# Patient Record
Sex: Male | Born: 1950 | Race: White | Hispanic: No | Smoking: Former smoker
Health system: Southern US, Community
[De-identification: ages and names within clinical notes are randomized; demographics above are authoritative.]

## PROBLEM LIST (undated history)

## (undated) DIAGNOSIS — J45909 Unspecified asthma, uncomplicated: Secondary | ICD-10-CM

## (undated) DIAGNOSIS — I10 Essential (primary) hypertension: Secondary | ICD-10-CM

## (undated) DIAGNOSIS — B019 Varicella without complication: Secondary | ICD-10-CM

## (undated) DIAGNOSIS — K4090 Unilateral inguinal hernia, without obstruction or gangrene, not specified as recurrent: Secondary | ICD-10-CM

## (undated) DIAGNOSIS — J449 Chronic obstructive pulmonary disease, unspecified: Secondary | ICD-10-CM

## (undated) HISTORY — DX: Unilateral inguinal hernia, without obstruction or gangrene, not specified as recurrent: K40.90

## (undated) HISTORY — DX: Unspecified asthma, uncomplicated: J45.909

## (undated) HISTORY — DX: Essential (primary) hypertension: I10

## (undated) HISTORY — DX: Varicella without complication: B01.9

---

## 2013-05-22 DIAGNOSIS — K4090 Unilateral inguinal hernia, without obstruction or gangrene, not specified as recurrent: Secondary | ICD-10-CM

## 2013-05-22 HISTORY — DX: Unilateral inguinal hernia, without obstruction or gangrene, not specified as recurrent: K40.90

## 2013-05-22 HISTORY — PX: HERNIA REPAIR: SHX51

## 2013-06-18 ENCOUNTER — Encounter: Payer: Self-pay | Admitting: General Surgery

## 2013-06-18 ENCOUNTER — Ambulatory Visit (INDEPENDENT_AMBULATORY_CARE_PROVIDER_SITE_OTHER): Payer: BC Managed Care – PPO | Admitting: General Surgery

## 2013-06-18 VITALS — BP 180/90 | HR 98 | Resp 14 | Ht 72.0 in | Wt 200.0 lb

## 2013-06-18 DIAGNOSIS — K4090 Unilateral inguinal hernia, without obstruction or gangrene, not specified as recurrent: Secondary | ICD-10-CM

## 2013-06-18 DIAGNOSIS — J209 Acute bronchitis, unspecified: Secondary | ICD-10-CM

## 2013-06-18 MED ORDER — PREDNISONE (PAK) 10 MG PO TABS: ORAL_TABLET | Freq: Every day | ORAL | Status: DC

## 2013-06-18 NOTE — Progress Notes (Addendum)
Patient ID: Jordan [name], male   DOB: [birth year], 63 y.o.   MRN: [id number]  Chief Complaint  Patient presents with  . Abdominal Pain    New pt evaluation of right inguinal hernia    HPI Jordan [name] is a 62 y.o. male who presents for an evaluation of a right inguinal hernia. The patient complains of abdominal pain that started on Monday morning. The pain is constant that is described as a burning pain. He states he injured himself 10 years ago when moving into a new home but has not had any problems until this week. At the time of his injury 10 years ago, he became aware of a small grape-sized nodule in the right groin. This has persisted without variance.  His current episode developed after a significant upper respiratory infection associated with prominent coughing. He was treated with both Tessilon pearls as well as a ventolin  inhaler at a local urgent care center last week.  The pain is triggered by sitting. Laying down helps to alleviate the pain. He states on Monday he started coughing really bad and that's when he noticed the swelling in the area.   HPI  Past Medical History  Diagnosis Date  . Abdominal pain     History reviewed. No pertinent past surgical history.  History reviewed. No pertinent family history.  Social History History  Substance Use Topics  . Smoking status: Current Every Day Smoker  . Smokeless tobacco: Never Used  . Alcohol Use: Yes    No Known Allergies  Current Outpatient Prescriptions  Medication Sig Dispense Refill  . HYDROcodone-acetaminophen (NORCO/VICODIN) 5-325 MG per tablet Take 1 tablet by mouth every 6 (six) hours as needed for moderate pain.       No current facility-administered medications for this visit.    Review of Systems Review of Systems  Constitutional: Negative.   Respiratory: Negative.   Cardiovascular: Negative.   Gastrointestinal: Positive for abdominal pain.    Blood pressure 180/90, pulse 98, resp.  rate 14, height 6' (1.829 m), weight 200 lb (90.719 kg).  Physical Exam Physical Exam  Constitutional: He is oriented to person, place, and time. He appears well-developed and well-nourished.  Neck: Neck supple. No thyromegaly present.  Cardiovascular: Normal rate, regular rhythm and normal heart sounds.   No murmur heard. Pulmonary/Chest: Effort normal. He has wheezes.  Abdominal: Soft. Normal appearance and bowel sounds are normal. A hernia is present. Hernia confirmed positive in the right inguinal area (reducible ).  Lymphadenopathy:    He has no cervical adenopathy.  Neurological: He is alert and oriented to person, place, and time.  Skin: Skin is warm and dry.    Data Reviewed Chest x-ray reportedly completed outside facility it to be reviewed.  Assessment    Bronchitis, still with wheezing above noted medications.  Reducible right inguinal hernia.     Plan    With the severity of his cough and ongoing bronchospasm, he'll be placed on a steroid taper. Smoking cessation has been encouraged. Once his upper respiratory infection resolved we can look to early scheduling for a right open inguinal hernia were appear with the use of prosthetic mesh. The pros and cons of prosthetic mesh usage including the small risk of infection but improved long-term result was reviewed.  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  An off work note was provided.       Patient instructed to call the office once he is feeling better and we will arrange for right inguinal hernia repair at [facility]. This patient will require a brief pre-op visit. His surgery will need to be arranged within one week of his phone call.   Earline MayotteByrnett, Jeffrey W 06/18/2013, 9:37 PM

## 2013-06-18 NOTE — Patient Instructions (Addendum)
Patient to be scheduled for hernia repair. The patient is advised to call with any new questions or concerns.   Hernia Repair with Laparoscope A hernia occurs when an internal organ pushes out through a weak spot in the belly (abdominal) wall muscles. Hernias most commonly occur in the groin and around the navel. Hernias can also occur through a cut by the surgeon (incision) after an abdominal operation. A hernia may be caused by:  Lifting heavy objects.  Prolonged coughing.  Straining to move your bowels. Hernias can often be pushed back into place (reduced). Most hernias tend to get worse over time. Problems occur when abdominal contents get stuck in the opening and the blood supply is blocked or impaired (incarcerated hernia). Because of these risks, you require surgery to repair the hernia. Your hernia will be repaired using a laparoscope. Laparoscopic surgery is a type of minimally invasive surgery. It does not involve making a typical surgical cut (incision) in the skin. A laparoscope is a telescope-like rod and lens system. It is usually connected to a video camera and a light source so your caregiver can clearly see the operative area. The instruments are inserted through  to  inch (5 mm or 10 mm) openings in the skin at specific locations. A working and viewing space is created by blowing a small amount of carbon dioxide gas into the abdominal cavity. The abdomen is essentially blown up like a balloon (insufflated). This elevates the abdominal wall above the internal organs like a dome. The carbon dioxide gas is common to the human body and can be absorbed by tissue and removed by the respiratory system. Once the repair is completed, the small incisions will be closed with either stitches (sutures) or staples (just like a paper stapler only this staple holds the skin together). LET YOUR CAREGIVERS KNOW ABOUT:  Allergies.  Medications taken including herbs, eye drops, over the counter  medications, and creams.  Use of steroids (by mouth or creams).  Previous problems with anesthetics or Novocaine.  Possibility of pregnancy, if this applies.  History of blood clots (thrombophlebitis).  History of bleeding or blood problems.  Previous surgery.  Other health problems. BEFORE THE PROCEDURE  Laparoscopy can be done either in a hospital or out-patient clinic. You may be given a mild sedative to help you relax before the procedure. Once in the operating room, you will be given a general anesthesia to make you sleep (unless you and your caregiver choose a different anesthetic).  AFTER THE PROCEDURE  After the procedure you will be watched in a recovery area. Depending on what type of hernia was repaired, you might be admitted to the hospital or you might go home the same day. With this procedure you may have less pain and scarring. This usually results in a quicker recovery and less risk of infection. HOME CARE INSTRUCTIONS   Bed rest is not required. You may continue your normal activities but avoid heavy lifting (more than 10 pounds) or straining.  Cough gently. If you are a smoker it is best to stop, as even the best hernia repair can break down with the continual strain of coughing.  Avoid driving until given the OK by your surgeon.  There are no dietary restrictions unless given otherwise.  TAKE ALL MEDICATIONS AS DIRECTED.  Only take over-the-counter or prescription medicines for pain, discomfort, or fever as directed by your caregiver. SEEK MEDICAL CARE IF:   There is increasing abdominal pain or pain in your  incisions.  There is more bleeding from incisions, other than minimal spotting.  You feel light headed or faint.  You develop an unexplained fever, chills, and/or an oral temperature above 102 F (38.9 C).  You have redness, swelling, or increasing pain in the wound.  Pus coming from wound.  A foul smell coming from the wound or dressings. SEEK  IMMEDIATE MEDICAL CARE IF:   You develop a rash.  You have difficulty breathing.  You have any allergic problems. MAKE SURE YOU:   Understand these instructions.  Will watch your condition.  Will get help right away if you are not doing well or get worse. Document Released: 05/08/2005 Document Revised: 07/31/2011 Document Reviewed: 04/07/2009 [facility] Patient Information 2014 [employer] [place].

## 2013-06-18 NOTE — Addendum Note (Signed)
Addended by: Earline MayotteBYRNETT, Kaleo Condrey W on: 06/18/2013 09:46 PM   Modules accepted: Orders

## 2013-06-19 ENCOUNTER — Encounter: Payer: Self-pay | Admitting: *Deleted

## 2013-06-19 ENCOUNTER — Telehealth: Payer: Self-pay | Admitting: *Deleted

## 2013-06-19 NOTE — Telephone Encounter (Signed)
Patient has been notified as instructed. Patient instructed to call our office with any questions or concerns.

## 2013-06-19 NOTE — Telephone Encounter (Signed)
Message copied by Darnelle CatalanRAWLEY, Rasul Decola F on Thu Jun 19, 2013  2:25 PM ------      Message from: [place] River CityBYRNETT, [place]JEFFREY W      Created: Wed Jun 18, 2013  9:46 PM       Please notify the patient I sent a prescription for prednisone to his pharmacy. He should start with 6 tablets, decreasing by one tablet daily until resolved. He should also make use of his Ventolin inhaler every 4 hours while awake until his respiratory symptoms have improved. ------

## 2013-07-02 ENCOUNTER — Encounter: Payer: Self-pay | Admitting: *Deleted

## 2013-07-07 ENCOUNTER — Encounter: Payer: Self-pay | Admitting: General Surgery

## 2013-07-23 ENCOUNTER — Telehealth: Payer: Self-pay | Admitting: *Deleted

## 2013-07-23 NOTE — Telephone Encounter (Signed)
Patient's surgery has been scheduled for 07-31-13 at [facility]. This patient will come in for a pre-op visit on Monday, 07-28-13 at 10 am.

## 2013-07-28 ENCOUNTER — Other Ambulatory Visit: Payer: Self-pay | Admitting: General Surgery

## 2013-07-28 ENCOUNTER — Encounter: Payer: Self-pay | Admitting: General Surgery

## 2013-07-28 ENCOUNTER — Ambulatory Visit (INDEPENDENT_AMBULATORY_CARE_PROVIDER_SITE_OTHER): Payer: BC Managed Care – PPO | Admitting: General Surgery

## 2013-07-28 VITALS — BP 130/70 | HR 78 | Resp 14 | Ht 72.0 in | Wt 189.0 lb

## 2013-07-28 DIAGNOSIS — K4090 Unilateral inguinal hernia, without obstruction or gangrene, not specified as recurrent: Secondary | ICD-10-CM

## 2013-07-28 NOTE — Progress Notes (Signed)
Patient ID: Jordan [name], male   DOB: 06-18-1950, 63 y.o.   MRN: [id number]  Chief Complaint  Patient presents with  . Pre-op Exam    right inguinal hernia    HPI Jordan MissDavid Alton Woldt is a 63 y.o. male  Here for a pre op exam for repair of right inguinal hernia, this has been scheduled for 07/31/13. When the patient was seen at the end of January 2015 significant wheezing was noted in both lung fields. He was placed on a short course of steroids.He reported slow resolution of his symptoms. At this time he feels his breathing is better.  The patient is still experiencing significant pain in the right groin, especially when he is active. Occasionally he'll feel pain moving up to the periumbilical area. He reports no difficulty with bowel function there is no change in his urinary pattern. No blood per rectum.  The patient's weight is down 11 pounds from his January exam.    HPI  Past Medical History  Diagnosis Date  . Abdominal pain     No past surgical history on file.  No family history on file.  Social History History  Substance Use Topics  . Smoking status: Current Every Day Smoker  . Smokeless tobacco: Never Used  . Alcohol Use: Yes    No Known Allergies  Current Outpatient Prescriptions  Medication Sig Dispense Refill  . albuterol (PROVENTIL HFA;VENTOLIN HFA) 108 (90 BASE) MCG/ACT inhaler Inhale 2 puffs into the lungs every 6 (six) hours as needed for wheezing or shortness of breath.      . benzonatate (TESSALON) 100 MG capsule Take 200 mg by mouth 3 (three) times daily as needed for cough.      Marland Kitchen. HYDROcodone-acetaminophen (NORCO/VICODIN) 5-325 MG per tablet Take 1 tablet by mouth every 6 (six) hours as needed for moderate pain.       No current facility-administered medications for this visit.    Review of Systems Review of Systems  Constitutional: Negative.   Respiratory: Negative.   Cardiovascular: Negative.     Blood pressure 130/70, pulse 78, resp. rate  14, height 6' (1.829 m), weight 189 lb (85.73 kg).  Physical Exam Physical Exam  Constitutional: He is oriented to person, place, and time. He appears well-developed and well-nourished.  Eyes: Conjunctivae are normal.  Neck: Neck supple.  Cardiovascular: Normal rate, regular rhythm and normal heart sounds.   Pulmonary/Chest: Breath sounds normal.  Abdominal: Soft. Normal appearance and bowel sounds are normal. There is no hepatosplenomegaly. A hernia is present. Hernia confirmed positive in the right inguinal area.    Lymphadenopathy:       Right: No inguinal adenopathy present.       Left: No inguinal adenopathy present.  Neurological: He is alert and oriented to person, place, and time.  Skin: Skin is warm and dry.     Assessment    Symptomatic right inguinal hernia. Reducible.     Plan    The patient is scheduled for elective hernia repair on July 31, 2013. I advised him that because he has been so symptomatic in the weeks leading up to this he may experience significant postoperative pain that many people did not experience. Will make use of anti-inflammatories to help minimize his pain.        Earline MayotteByrnett, Jordan W 07/28/2013, 9:29 PM

## 2013-07-28 NOTE — Patient Instructions (Signed)

## 2013-07-31 ENCOUNTER — Ambulatory Visit: Payer: Self-pay | Admitting: General Surgery

## 2013-07-31 DIAGNOSIS — K4090 Unilateral inguinal hernia, without obstruction or gangrene, not specified as recurrent: Secondary | ICD-10-CM

## 2013-08-01 ENCOUNTER — Encounter: Payer: Self-pay | Admitting: General Surgery

## 2013-08-13 ENCOUNTER — Ambulatory Visit (INDEPENDENT_AMBULATORY_CARE_PROVIDER_SITE_OTHER): Payer: BC Managed Care – PPO | Admitting: General Surgery

## 2013-08-13 ENCOUNTER — Encounter: Payer: Self-pay | Admitting: General Surgery

## 2013-08-13 VITALS — BP 160/90 | HR 80 | Temp 96.8°F | Resp 14 | Ht 72.0 in | Wt 203.0 lb

## 2013-08-13 DIAGNOSIS — K4090 Unilateral inguinal hernia, without obstruction or gangrene, not specified as recurrent: Secondary | ICD-10-CM

## 2013-08-13 NOTE — Progress Notes (Signed)
Patient ID: Jordan MissDavid Alton Mazariego, male   DOB: Mar 01, 1951, 63 y.o.   MRN: [id number]  Chief Complaint  Patient presents with  . Routine Post Op    2 week post op right inguinal hernia repair.     HPI Jordan [name] is a 63 y.o. male who presents for a 2 week post op right inguinal hernia repair. The repair was performed on 07/31/13. The patient complains of chills, soreness, and swelling in the area of the repair. The patient has no fever at this time. He is still using the pain medication prescribed at this time. The patient continues to make use of Aleve, 2 tablets b.i.d. As requested. He is applying local heat frequently. The patient reports no difficulty with bowel or bladder function.  HPI  Past Medical History  Diagnosis Date  . Abdominal pain   . Inguinal hernia 2015    Past Surgical History  Procedure Laterality Date  . Hernia repair Right 2015    inguinal hernia    History reviewed. No pertinent family history.  Social History History  Substance Use Topics  . Smoking status: Current Every Day Smoker  . Smokeless tobacco: Never Used  . Alcohol Use: Yes    No Known Allergies  Current Outpatient Prescriptions  Medication Sig Dispense Refill  . albuterol (PROVENTIL HFA;VENTOLIN HFA) 108 (90 BASE) MCG/ACT inhaler Inhale 2 puffs into the lungs every 6 (six) hours as needed for wheezing or shortness of breath.      . benzonatate (TESSALON) 100 MG capsule Take 200 mg by mouth 3 (three) times daily as needed for cough.      Marland Kitchen. HYDROcodone-acetaminophen (NORCO/VICODIN) 5-325 MG per tablet Take 1 tablet by mouth every 6 (six) hours as needed for moderate pain.      . naproxen sodium (ANAPROX) 220 MG tablet Take 440 mg by mouth 2 (two) times daily.       No current facility-administered medications for this visit.    Review of Systems Review of Systems  Constitutional: Positive for chills.  Respiratory: Negative.   Cardiovascular: Negative.   Gastrointestinal: Negative.      Blood pressure 160/90, pulse 80, temperature 96.8 F (36 C), temperature source Oral, resp. rate 14, height 6' (1.829 m), weight 203 lb (92.08 kg).  Physical Exam Physical Exam  Constitutional: He is oriented to person, place, and time. He appears well-developed and well-nourished.  Pulmonary/Chest: Effort normal and breath sounds normal.  Abdominal: Soft. Normal appearance and bowel sounds are normal. There is no hepatosplenomegaly. There is no tenderness. No hernia.  Hernia repair intact. Normal healing ridge. Minimal cord swelling, No testicular edema  Neurological: He is alert and oriented to person, place, and time.  Skin: Skin is warm and dry.       Assessment    Doing well status post right aorta repair.     Plan    The patient was reassured that the healing ridge below the incision is a normal finding and should resolve with time. Continued use of local heat to help resolve the residual swelling was encouraged. He should continue use of Aleve b.i.d. Until the soreness is resolved.  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 We'll plan for a followup exam in one month.     PCP: No PCP per patient Ref. Physician: [facility] Dr. Gaynelle ArabianBrady Blumling    Earline MayotteByrnett, Mikeila Burgen W 08/13/2013, 7:58 PM

## 2013-08-13 NOTE — Patient Instructions (Signed)
Patient to return in 1 month. Patient to continue using heat and aleve for pain. The patient is aware to call back for any questions or concerns.

## 2013-09-15 ENCOUNTER — Ambulatory Visit (INDEPENDENT_AMBULATORY_CARE_PROVIDER_SITE_OTHER): Payer: BC Managed Care – PPO | Admitting: General Surgery

## 2013-09-15 ENCOUNTER — Encounter: Payer: Self-pay | Admitting: General Surgery

## 2013-09-15 VITALS — BP 134/76 | HR 76 | Resp 12 | Ht 72.0 in | Wt 203.0 lb

## 2013-09-15 DIAGNOSIS — K4090 Unilateral inguinal hernia, without obstruction or gangrene, not specified as recurrent: Secondary | ICD-10-CM

## 2013-09-15 NOTE — Progress Notes (Signed)
Patient ID: Jordan [name], male   DOB: [birth year], 63 y.o.   MRN: [id number]  Chief Complaint  Patient presents with  . Routine Post Op    one month right inguinal hernia    HPI Jordan [name] is a 63 y.o. male who presents for a 6 week post op evaluation status post right inguinal hernia repair. The repair was performed on 07/31/13. Patient states he is doing well. The mild residual pain reported at his last visit has resolved.   HPI  Past Medical History  Diagnosis Date  . Abdominal pain   . Inguinal hernia 2015    Past Surgical History  Procedure Laterality Date  . Hernia repair Right 2015    inguinal hernia    No family history on file.  Social History History  Substance Use Topics  . Smoking status: Current Every Day Smoker  . Smokeless tobacco: Never Used  . Alcohol Use: Yes    No Known Allergies  Current Outpatient Prescriptions  Medication Sig Dispense Refill  . albuterol (PROVENTIL HFA;VENTOLIN HFA) 108 (90 BASE) MCG/ACT inhaler Inhale 2 puffs into the lungs every 6 (six) hours as needed for wheezing or shortness of breath.      . benzonatate (TESSALON) 100 MG capsule Take 200 mg by mouth 3 (three) times daily as needed for cough.      . CHERATUSSIN AC 100-10 MG/5ML syrup       . clarithromycin (BIAXIN XL) 500 MG 24 hr tablet Take 1 tablet by mouth daily.      . naproxen sodium (ANAPROX) 220 MG tablet Take 440 mg by mouth 2 (two) times daily.      . predniSONE (DELTASONE) 10 MG tablet Take 1 tablet by mouth daily.       No current facility-administered medications for this visit.    Review of Systems Review of Systems  Constitutional: Negative.   Respiratory: Negative.   Cardiovascular: Negative.     Blood pressure 134/76, pulse 76, resp. rate 12, height 6' (1.829 m), weight 203 lb (92.08 kg).  Physical Exam Physical Exam  Constitutional: He is oriented to person, place, and time. He appears well-developed and well-nourished.  Abdominal:   Right inguinal repair looks clean and well healed. .  Neurological: He is alert and oriented to person, place, and time.  Skin: Skin is warm and dry.       Assessment    Doing well status post inguinal hernia repair.     Plan    The patient is released to full activities. Good judgment was strenuous activity has been discussed. The paperwork provided by the patient for his disability will be completed by the nurse next week. Follow up care will be on an as needed basis.          Merrily PewJeffrey W Lue Sykora 09/15/2013, 9:32 PM

## 2013-09-15 NOTE — Patient Instructions (Signed)
Patient to return as needed. Proper lifting techniques reviewed. 

## 2014-01-28 ENCOUNTER — Ambulatory Visit: Payer: Self-pay

## 2014-09-12 NOTE — Op Note (Signed)
PATIENT NAME:  Jordan [name], Jordan [name] MR#:  [id number] DATE OF BIRTH:  1951-03-08  DATE OF PROCEDURE:  07/31/2013  PREOPERATIVE DIAGNOSIS:  Right inguinal hernia.   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   OPERATIVE PROCEDURE:  Excision of lipoma of the cord and repair of direct inguinal hernia with Ultrapro mesh.   SURGEON:  Donnalee CurryJeffrey Samit Sylve, M.D.   ANESTHESIA:  General by LMA under Dr. Ronalee BeltsBhandari, Marcaine 0.5% with 1:200,000 units epinephrine, 30 mL local infiltration, Toradol 30 mg.   ESTIMATED BLOOD LOSS:  Minimal.   CLINICAL NOTE:  This 64 year old male has developed [name] symptomatic right inguinal hernia.  He was admitted for elective repair.  He received Kefzol prior to the procedure.   OPERATIVE NOTE:  With the patient under adequate general anesthesia and hair previously removed with clippers, the area was prepped with ChloraPrep and draped.  [name] over the anticipated course of the inguinal canal was carried down through the skin and subcutaneous tissue with hemostasis achieved by electrocautery.  3-0 Vicryl ties were used as well.  The external oblique was opened in the direction of its fibers.  The ilioinguinal, iliohypogastric and genitofemoral nerve were all identified and protected.  The thin cremasteric fibers were split and the cord was mobilized.  There was found to be [name] which was dissected back to the level of the internal ring, ligated with 3-0 ties of Vicryl and then discarded.  The direct hernia sac was excised and discarded.  The preperitoneal space was cleared.  [name] was smoothed into position in the preperitoneal position and then the external component laid along the floor of the inguinal canal.  [name] was made for cord passage.  The mesh was anchored to the pubic tubercle with 0 Surgilon and then along the inguinal ligament and the transverse abdominis aponeurosis or inferior  as well as medial and superior coverage, respectively.  Toradol was placed into the wound and the external oblique closed with [name].  The Scarpa's fascia was closed with 3-0 Vicryl, and the skin approximated with [name].  Benzoin, Steri-Strips, Telfa and Tegaderm dressings were then applied.    The patient tolerated the procedure well and was taken recovery in stable condition.    ____________________________ Earline MayotteJeffrey W. Iram Astorino, MD jwb:ea D: 07/31/2013 16:29:28 ET T: 08/01/2013 05:18:12 ET JOB#: [id number]  cc: Earline MayotteJeffrey W. Jamoni Broadfoot, MD, <Dictator> Alphonzo Severanceeborah D. Brady, NP Earline MayotteJeffrey W. Sheralyn Pinegar, MD  Reaghan Kawa Brion AlimentW Antonius Hartlage MD ELECTRONICALLY SIGNED 08/01/2013 11:17

## 2014-10-10 ENCOUNTER — Other Ambulatory Visit: Payer: Self-pay

## 2014-10-10 ENCOUNTER — Emergency Department
Admission: EM | Admit: 2014-10-10 | Discharge: 2014-10-10 | Disposition: A | Discharge status: Home / Self Care | Payer: BLUE CROSS/BLUE SHIELD | Attending: Emergency Medicine | Admitting: Emergency Medicine

## 2014-10-10 ENCOUNTER — Encounter: Payer: Self-pay | Admitting: Emergency Medicine

## 2014-10-10 ENCOUNTER — Emergency Department: Payer: BLUE CROSS/BLUE SHIELD

## 2014-10-10 DIAGNOSIS — J441 Chronic obstructive pulmonary disease with (acute) exacerbation: Secondary | ICD-10-CM | POA: Insufficient documentation

## 2014-10-10 DIAGNOSIS — Z79899 Other long term (current) drug therapy: Secondary | ICD-10-CM | POA: Insufficient documentation

## 2014-10-10 DIAGNOSIS — Z720 Tobacco use: Secondary | ICD-10-CM | POA: Insufficient documentation

## 2014-10-10 DIAGNOSIS — Z791 Long term (current) use of non-steroidal anti-inflammatories (NSAID): Secondary | ICD-10-CM | POA: Insufficient documentation

## 2014-10-10 DIAGNOSIS — Z792 Long term (current) use of antibiotics: Secondary | ICD-10-CM | POA: Insufficient documentation

## 2014-10-10 LAB — BASIC METABOLIC PANEL
Anion gap: 9 (ref 5–15)
BUN: 13 mg/dL (ref 6–20)
CO2: 25 mmol/L (ref 22–32)
Calcium: 9.7 mg/dL (ref 8.9–10.3)
Chloride: 104 mmol/L (ref 101–111)
Creatinine, Ser: 1.06 mg/dL (ref 0.61–1.24)
GFR calc Af Amer: >60 mL/min (ref >60)
GFR calc non Af Amer: >60 mL/min (ref >60)
Glucose, Bld: 112 mg/dL — ABNORMAL HIGH (ref 65–99)
Potassium: 4.2 mmol/L (ref 3.5–5.1)
Sodium: 138 mmol/L (ref 135–145)

## 2014-10-10 LAB — CBC
HCT: 46.2 % (ref 40.0–52.0)
Hemoglobin: 15.4 g/dL (ref 13.0–18.0)
MCH: 30.2 pg (ref 26.0–34.0)
MCHC: 33.3 g/dL (ref 32.0–36.0)
MCV: 90.7 fL (ref 80.0–100.0)
Platelets: 266 10*3/uL (ref 150–440)
RBC: 5.1 MIL/uL (ref 4.40–5.90)
RDW: 13.7 % (ref 11.5–14.5)
WBC: 12.4 10*3/uL — AB (ref 3.8–10.6)

## 2014-10-10 LAB — TROPONIN I: Troponin I: <0.03 ng/mL (ref <0.031)

## 2014-10-10 MED ORDER — PREDNISONE 20 MG PO TABS
ORAL_TABLET | ORAL | Status: AC
  Administered 2014-10-10: 60 mg via ORAL
  Filled 2014-10-10: qty 3

## 2014-10-10 MED ORDER — IPRATROPIUM-ALBUTEROL 0.5-2.5 (3) MG/3ML IN SOLN
RESPIRATORY_TRACT | Status: AC
  Administered 2014-10-10: 3 mL via RESPIRATORY_TRACT
  Filled 2014-10-10: qty 3

## 2014-10-10 MED ORDER — PREDNISONE 20 MG PO TABS
60.0000 mg | ORAL_TABLET | Freq: Once | ORAL | Status: AC
  Administered 2014-10-10: 60 mg via ORAL

## 2014-10-10 MED ORDER — IPRATROPIUM-ALBUTEROL 0.5-2.5 (3) MG/3ML IN SOLN
3.0000 mL | Freq: Once | RESPIRATORY_TRACT | Status: AC
  Administered 2014-10-10: 3 mL via RESPIRATORY_TRACT

## 2014-10-10 MED ORDER — PREDNISONE 10 MG (21) PO TBPK: ORAL_TABLET | ORAL | Status: DC

## 2014-10-10 MED ORDER — ALBUTEROL SULFATE HFA 108 (90 BASE) MCG/ACT IN AERS: 2.0000 | INHALATION_SPRAY | Freq: Four times a day (QID) | RESPIRATORY_TRACT | Status: DC | PRN

## 2014-10-10 MED ORDER — ALBUTEROL SULFATE (2.5 MG/3ML) 0.083% IN NEBU
INHALATION_SOLUTION | RESPIRATORY_TRACT | Status: AC
  Administered 2014-10-10: 2.5 mg via RESPIRATORY_TRACT
  Filled 2014-10-10: qty 3

## 2014-10-10 MED ORDER — ALBUTEROL SULFATE (2.5 MG/3ML) 0.083% IN NEBU
2.5000 mg | INHALATION_SOLUTION | Freq: Once | RESPIRATORY_TRACT | Status: AC
  Administered 2014-10-10: 2.5 mg via RESPIRATORY_TRACT

## 2014-10-10 MED ORDER — AZITHROMYCIN 250 MG PO TABS: ORAL_TABLET | ORAL | Status: DC

## 2014-10-10 NOTE — ED Provider Notes (Signed)
[facility] Emergency Department Provider Note  ____________________________________________  Time seen: Approximately 1:10 PM  I have reviewed the triage vital signs and the nursing notes.   HISTORY  Chief Complaint Shortness of Breath    HPI Jordan [name] is a 64 y.o. male who presents to the emergency department for shortness of breath that started at approximately 4 this am. He reports that he has had a "cold" for a week or so. Cough was productive, but over the past couple of days he has not been able to cough anything up. He reports chills and subjective fever 2 nights ago.   Past Medical History  Diagnosis Date  . Abdominal pain   . Inguinal hernia 2015    Patient Active Problem List   Diagnosis Date Noted  . Inguinal hernia 06/18/2013    Past Surgical History  Procedure Laterality Date  . Hernia repair Right 2015    inguinal hernia    Current Outpatient Rx  Name  Route  Sig  Dispense  Refill  . albuterol (PROVENTIL HFA;VENTOLIN HFA) 108 (90 BASE) MCG/ACT inhaler   Inhalation   Inhale 2 puffs into the lungs every 6 (six) hours as needed for wheezing or shortness of breath.   1 Inhaler   0   . azithromycin (ZITHROMAX Z-PAK) 250 MG tablet      Take 2 tablets (500 mg) on  Day 1,  followed by 1 tablet (250 mg) once daily on Days 2 through 5.   6 each   0   . benzonatate (TESSALON) 100 MG capsule   Oral   Take 200 mg by mouth 3 (three) times daily as needed for cough.         . CHERATUSSIN AC 100-10 MG/5ML syrup               . clarithromycin (BIAXIN XL) 500 MG 24 hr tablet   Oral   Take 1 tablet by mouth daily.         . naproxen sodium (ANAPROX) 220 MG tablet   Oral   Take 440 mg by mouth 2 (two) times daily.         . predniSONE (STERAPRED UNI-PAK 21 TAB) 10 MG (21) TBPK tablet      Take 6 tablets on day 1 Take 5 tablets on day 2 Take 4 tablets on day 3 Take 3 tablets on day 4 Take 2 tablets on day  5 Take 1 tablet on day 6   21 tablet   0     Allergies Review of patient's allergies indicates no known allergies.  No family history on file.  Social History History  Substance Use Topics  . Smoking status: Current Every Day Smoker -- 0.50 packs/day  . Smokeless tobacco: Never Used  . Alcohol Use: No    Review of Systems Constitutional: No fever/chills today Eyes: No visual changes. ENT: No sore throat. Cardiovascular: Denies chest pain. Respiratory: Positive for shortness of breath. Gastrointestinal: No abdominal pain.  No nausea, no vomiting.  No diarrhea.  No constipation. Genitourinary: Negative for dysuria. Musculoskeletal: Negative for back pain. Skin: Negative for rash. Neurological: Negative for headaches, focal weakness or numbness.  10-point ROS otherwise negative.  ____________________________________________   PHYSICAL EXAM:  VITAL SIGNS: ED Triage Vitals  Enc Vitals Group     BP 10/10/14 1240 130/99 mmHg     Pulse Rate 10/10/14 1240 99     Resp 10/10/14 1240 22     Temp  10/10/14 1240 97.8 F (36.6 C)     Temp Source 10/10/14 1240 Oral     SpO2 10/10/14 1240 100 %     Weight 10/10/14 1240 200 lb (90.719 kg)     Height 10/10/14 1240 6' (1.829 m)     Head Cir --      Peak Flow --      Pain Score 10/10/14 1241 0     Pain Loc --      Pain Edu? --      Excl. in GC? --     Constitutional: Alert and oriented. Well appearing and in no acute distress. Eyes: Conjunctivae are normal. PERRL. EOMI. Head: Atraumatic. Nose: No congestion/rhinnorhea. Mouth/Throat: Mucous membranes are moist.  Oropharynx non-erythematous. Neck: No stridor.   Hematological/Lymphatic/Immunilogical: No cervical lymphadenopathy. Cardiovascular: Normal rate, regular rhythm. Grossly normal heart sounds.  Good peripheral circulation. Respiratory: Normal respiratory effort.  No retractions. Wheezing present throughout. Gastrointestinal: Soft and nontender. No distention. No  abdominal bruits. No CVA tenderness. Musculoskeletal: No lower extremity tenderness nor edema.  No joint effusions. Neurologic:  Normal speech and language. No gross focal neurologic deficits are appreciated. Speech is normal. No gait instability. Skin:  Skin is warm, dry and intact. No rash noted. Psychiatric: Mood and affect are normal. Speech and behavior are normal.  ____________________________________________   LABS (all labs ordered are listed, but only abnormal results are displayed)  Labs Reviewed  CBC - Abnormal; Notable for the following:    WBC 12.4 (*)    All other components within normal limits  BASIC METABOLIC PANEL - Abnormal; Notable for the following:    Glucose, Bld 112 (*)    All other components within normal limits  TROPONIN I   ____________________________________________  EKG  Normal sinus rhythm with a rate of 94 ____________________________________________  RADIOLOGY  Chest x-ray negative for infiltrate or opacity. Consistent with COPD changes. ____________________________________________   PROCEDURES  Procedure(s) performed: None  Critical Care performed: No  ____________________________________________   INITIAL IMPRESSION / ASSESSMENT AND PLAN / ED COURSE  Pertinent labs & imaging results that were available during my care of the patient were reviewed by me and considered in my medical decision making (see chart for details).  Initial impression bronchitis versus pneumonia. Albuterol SVN ordered as well as labs and chest x-ray.  Presumptive improved after DuoNeb treatment. Patient was strongly advised to stop smoking. He is reportedly unaware of COPD diagnosis. We discussed this in length. He was encouraged to establish primary care provider for long-term management. He'll be discharged home with prednisone and albuterol. Return precautions discussed. ____________________________________________   FINAL CLINICAL IMPRESSION(S) / ED  DIAGNOSES  Final diagnoses:  COPD with acute exacerbation     Chinita Pester, FNP 10/10/14 1454  Jene Every, MD 10/10/14 1459

## 2014-10-10 NOTE — ED Notes (Signed)
Appears uncomfortable, speaking 4 word sentence

## 2014-10-10 NOTE — ED Notes (Signed)
NAD noted at time of D/C. Pt denies questions or concerns. Pt ambulatory to the lobby at this time.  

## 2014-10-10 NOTE — ED Provider Notes (Signed)
EKG interpreted by me  Date: 10/10/2014  Rate: 94  Rhythm: normal sinus rhythm  QRS Axis: normal  Intervals: normal  ST/T Wave abnormalities: normal  Conduction Disutrbances: none  Narrative Interpretation: unremarkable      Jordan Everyobert Adren Dollins, MD 10/10/14 1318

## 2014-10-21 ENCOUNTER — Other Ambulatory Visit: Payer: Self-pay

## 2014-10-21 ENCOUNTER — Encounter: Payer: Self-pay | Admitting: Emergency Medicine

## 2014-10-21 ENCOUNTER — Emergency Department: Payer: BLUE CROSS/BLUE SHIELD

## 2014-10-21 ENCOUNTER — Emergency Department
Admission: EM | Admit: 2014-10-21 | Discharge: 2014-10-21 | Disposition: A | Discharge status: Home / Self Care | Payer: BLUE CROSS/BLUE SHIELD | Attending: Emergency Medicine | Admitting: Emergency Medicine

## 2014-10-21 DIAGNOSIS — J441 Chronic obstructive pulmonary disease with (acute) exacerbation: Secondary | ICD-10-CM

## 2014-10-21 DIAGNOSIS — Z720 Tobacco use: Secondary | ICD-10-CM | POA: Insufficient documentation

## 2014-10-21 HISTORY — DX: Chronic obstructive pulmonary disease, unspecified: J44.9

## 2014-10-21 LAB — CBC
HEMATOCRIT: 44.3 % (ref 40.0–52.0)
Hemoglobin: 15.1 g/dL (ref 13.0–18.0)
MCH: 31.1 pg (ref 26.0–34.0)
MCHC: 34 g/dL (ref 32.0–36.0)
MCV: 91.5 fL (ref 80.0–100.0)
PLATELETS: 288 10*3/uL (ref 150–440)
RBC: 4.84 MIL/uL (ref 4.40–5.90)
RDW: 13.8 % (ref 11.5–14.5)
WBC: 11.2 10*3/uL — ABNORMAL HIGH (ref 3.8–10.6)

## 2014-10-21 LAB — BASIC METABOLIC PANEL
ANION GAP: 9 (ref 5–15)
BUN: 14 mg/dL (ref 6–20)
CALCIUM: 9.3 mg/dL (ref 8.9–10.3)
CO2: 24 mmol/L (ref 22–32)
Chloride: 103 mmol/L (ref 101–111)
Creatinine, Ser: 1.09 mg/dL (ref 0.61–1.24)
GFR calc Af Amer: >60 mL/min (ref >60)
GFR calc non Af Amer: >60 mL/min (ref >60)
Glucose, Bld: 116 mg/dL — ABNORMAL HIGH (ref 65–99)
Potassium: 4.1 mmol/L (ref 3.5–5.1)
Sodium: 136 mmol/L (ref 135–145)

## 2014-10-21 LAB — BRAIN NATRIURETIC PEPTIDE: B NATRIURETIC PEPTIDE 5: 12 pg/mL (ref 0.0–100.0)

## 2014-10-21 LAB — TROPONIN I: Troponin I: <0.03 ng/mL (ref <0.031)

## 2014-10-21 MED ORDER — ALBUTEROL SULFATE HFA 108 (90 BASE) MCG/ACT IN AERS: 2.0000 | INHALATION_SPRAY | RESPIRATORY_TRACT | Status: DC | PRN

## 2014-10-21 MED ORDER — IPRATROPIUM-ALBUTEROL 0.5-2.5 (3) MG/3ML IN SOLN
RESPIRATORY_TRACT | Status: AC
  Administered 2014-10-21: 3 mL via RESPIRATORY_TRACT
  Filled 2014-10-21: qty 3

## 2014-10-21 MED ORDER — IPRATROPIUM-ALBUTEROL 0.5-2.5 (3) MG/3ML IN SOLN
3.0000 mL | RESPIRATORY_TRACT | Status: AC
  Administered 2014-10-21: 3 mL via RESPIRATORY_TRACT

## 2014-10-21 MED ORDER — PREDNISONE 20 MG PO TABS
60.0000 mg | ORAL_TABLET | Freq: Once | ORAL | Status: AC
  Administered 2014-10-21: 60 mg via ORAL

## 2014-10-21 MED ORDER — IPRATROPIUM-ALBUTEROL 0.5-2.5 (3) MG/3ML IN SOLN
RESPIRATORY_TRACT | Status: AC
  Filled 2014-10-21: qty 3

## 2014-10-21 MED ORDER — PREDNISONE 20 MG PO TABS
ORAL_TABLET | ORAL | Status: AC
  Administered 2014-10-21: 60 mg via ORAL
  Filled 2014-10-21: qty 3

## 2014-10-21 MED ORDER — PREDNISONE 20 MG PO TABS: 40.0000 mg | ORAL_TABLET | Freq: Every day | ORAL | Status: DC

## 2014-10-21 MED ORDER — DOXYCYCLINE HYCLATE 50 MG PO CAPS: 100.0000 mg | ORAL_CAPSULE | Freq: Two times a day (BID) | ORAL | Status: DC

## 2014-10-21 MED ORDER — IPRATROPIUM-ALBUTEROL 0.5-2.5 (3) MG/3ML IN SOLN
3.0000 mL | Freq: Once | RESPIRATORY_TRACT | Status: AC
  Administered 2014-10-21: 3 mL via RESPIRATORY_TRACT

## 2014-10-21 NOTE — ED Notes (Signed)
Pt presents with shortness of breath today. Was seen here last week and dx with bronchitis. Audible wheezing throughout.

## 2014-10-21 NOTE — Discharge Instructions (Signed)

## 2014-10-21 NOTE — ED Notes (Signed)
Pt reports he was diagnosed with Bronchitis about 2 weeks ago, pt reports he finished his antibiotics. Pt reports he felt good for about a day and started to feel tight in his lungs, pt states he quit smoking about 5 days ago, pt has a non-productive cough. Pt denies any fever at home. Pt denies any pain. Pt talks in complete sentences no respiratory distress noted.

## 2014-10-21 NOTE — ED Notes (Signed)
Pt verbalizes the importance to follow pt with PCP

## 2014-10-21 NOTE — ED Provider Notes (Signed)
[facility] Emergency Department Provider Note  ____________________________________________  Time seen: 7:30 PM  I have reviewed the triage vital signs and the nursing notes.   HISTORY  Chief Complaint Shortness of Breath    HPI Jordan [name] is a 64 y.o. male who complains of persistent shortness of breath and nonproductive cough for the past 5 days. He was treated with azithromycin 2 weeks ago for bronchitis, but felt that after he finished the course his symptoms started getting worse again. The shortness of breath is worse with walking but not associated with any chest pain headache lightheadedness syncope abdominal pain and back pain. He has no PND or orthopnea. He does not have a primary care doctor at this time     Past Medical History  Diagnosis Date  . Abdominal pain   . Inguinal hernia 2015  . COPD (chronic obstructive pulmonary disease)     Patient Active Problem List   Diagnosis Date Noted  . Inguinal hernia 06/18/2013    Past Surgical History  Procedure Laterality Date  . Hernia repair Right 2015    inguinal hernia    Current Outpatient Rx  Name  Route  Sig  Dispense  Refill  . albuterol (PROVENTIL HFA) 108 (90 BASE) MCG/ACT inhaler   Inhalation   Inhale 2 puffs into the lungs every 4 (four) hours as needed for wheezing or shortness of breath.   1 Inhaler   0   . doxycycline (VIBRAMYCIN) 50 MG capsule   Oral   Take 2 capsules (100 mg total) by mouth 2 (two) times daily.   56 capsule   0   . predniSONE (DELTASONE) 20 MG tablet   Oral   Take 2 tablets (40 mg total) by mouth daily.   10 tablet   0     Allergies Review of patient's allergies indicates no known allergies.  History reviewed. No pertinent family history.  Social History History  Substance Use Topics  . Smoking status: Current Every Day Smoker -- 0.50 packs/day  . Smokeless tobacco: Never Used  . Alcohol Use: No    Review of  Systems  Constitutional: No fever or chills. No weight changes Eyes:No blurry vision or double vision.  ENT: No sore throat. Cardiovascular: No chest pain. Respiratory: Dyspnea and nonproductive cough Gastrointestinal: Negative for abdominal pain, vomiting and diarrhea.  No BRBPR or melena. Genitourinary: Negative for dysuria, urinary retention, bloody urine, or difficulty urinating. Musculoskeletal: Negative for back pain. No joint swelling or pain. Skin: Negative for rash. Neurological: Negative for headaches, focal weakness or numbness. Psychiatric:No anxiety or depression.   Endocrine:No hot/cold intolerance, changes in energy, or sleep difficulty.  10-point ROS otherwise negative.  ____________________________________________   PHYSICAL EXAM:  VITAL SIGNS: ED Triage Vitals  Enc Vitals Group     BP 10/21/14 1806 148/96 mmHg     Pulse Rate 10/21/14 1806 93     Resp 10/21/14 1806 20     Temp 10/21/14 1806 98 F (36.7 C)     Temp Source 10/21/14 1806 Oral     SpO2 10/21/14 1806 94 %     Weight 10/21/14 1806 200 lb (90.719 kg)     Height 10/21/14 1806 6' (1.829 m)     Head Cir --      Peak Flow --      Pain Score --      Pain Loc --      Pain Edu? --      Excl. in  GC? --      Constitutional: Alert and oriented. Well appearing and in no distress. Eyes: No scleral icterus. No conjunctival pallor. PERRL. EOMI ENT   Head: Normocephalic and atraumatic.   Nose: No congestion/rhinnorhea. No septal hematoma   Mouth/Throat: MMM, no pharyngeal erythema. No peritonsillar mass. No uvula shift.   Neck: No stridor. No SubQ emphysema. No meningismus. Hematological/Lymphatic/Immunilogical: No cervical lymphadenopathy. Cardiovascular: RRR. Normal and symmetric distal pulses are present in all extremities. No murmurs, rubs, or gallops. Respiratory: Expiratory wheezing diffusely. Symmetric breath sounds throughout without focal consolidation.  Gastrointestinal: Soft  and nontender. No distention. There is no CVA tenderness.  No rebound, rigidity, or guarding. Genitourinary: deferred Musculoskeletal: Nontender with normal range of motion in all extremities. No joint effusions.  No lower extremity tenderness.  No edema. Neurologic:   Normal speech and language.  CN 2-10 normal. Motor grossly intact. No pronator drift.  Normal gait. No gross focal neurologic deficits are appreciated.  Skin:  Skin is warm, dry and intact. No rash noted.  No petechiae, purpura, or bullae. Psychiatric: Mood and affect are normal. Speech and behavior are normal. Patient exhibits appropriate insight and judgment.  ____________________________________________    LABS (pertinent positives/negatives) (all labs ordered are listed, but only abnormal results are displayed) Labs Reviewed  CBC - Abnormal; Notable for the following:    WBC 11.2 (*)    All other components within normal limits  BASIC METABOLIC PANEL - Abnormal; Notable for the following:    Glucose, Bld 116 (*)    All other components within normal limits  BRAIN NATRIURETIC PEPTIDE  TROPONIN I   ____________________________________________   EKG  Interpreted by me  Date: 10/21/2014  Rate: 90  Rhythm: normal sinus rhythm  QRS Axis: normal  Intervals: normal  ST/T Wave abnormalities: normal  Conduction Disutrbances: none  Narrative Interpretation: unremarkable      ____________________________________________    RADIOLOGY  Chest x-ray consistent with emphysema, no acute pathology  ____________________________________________   PROCEDURES  ____________________________________________   INITIAL IMPRESSION / ASSESSMENT AND PLAN / ED COURSE  Pertinent labs & imaging results that were available during my care of the patient were reviewed by me and considered in my medical decision making (see chart for details).  Patient presents with acute COPD exacerbation related to chronic emphysema.  I encouraged the patient again primary care doctor due to his chronic medical problems. We'll give him steroids and DuoNeb here in the ED for symptomatic relief. His vital signs are unremarkable and there is no evidence of pneumonia. I have low suspicion for ACS TAD carditis mediastinitis or PE. We'll then discharge him with a course of prednisone, doxycycline, and an albuterol inhaler prescription.  ____________________________________________   FINAL CLINICAL IMPRESSION(S) / ED DIAGNOSES  Final diagnoses:  COPD with acute exacerbation      Sharman Cheek, MD 10/21/14 [phone]

## 2014-10-30 ENCOUNTER — Emergency Department: Payer: BLUE CROSS/BLUE SHIELD

## 2014-10-30 ENCOUNTER — Encounter: Payer: Self-pay | Admitting: Emergency Medicine

## 2014-10-30 ENCOUNTER — Inpatient Hospital Stay
Admission: EM | Admit: 2014-10-30 | Discharge: 2014-11-02 | DRG: 189 | Disposition: A | Discharge status: Home / Self Care | Payer: BLUE CROSS/BLUE SHIELD | Attending: Internal Medicine | Admitting: Internal Medicine

## 2014-10-30 DIAGNOSIS — J441 Chronic obstructive pulmonary disease with (acute) exacerbation: Secondary | ICD-10-CM | POA: Diagnosis present

## 2014-10-30 DIAGNOSIS — Z720 Tobacco use: Secondary | ICD-10-CM | POA: Diagnosis present

## 2014-10-30 DIAGNOSIS — Z716 Tobacco abuse counseling: Secondary | ICD-10-CM | POA: Diagnosis present

## 2014-10-30 DIAGNOSIS — Z833 Family history of diabetes mellitus: Secondary | ICD-10-CM

## 2014-10-30 DIAGNOSIS — I10 Essential (primary) hypertension: Secondary | ICD-10-CM | POA: Diagnosis present

## 2014-10-30 DIAGNOSIS — J209 Acute bronchitis, unspecified: Secondary | ICD-10-CM | POA: Diagnosis present

## 2014-10-30 DIAGNOSIS — J9601 Acute respiratory failure with hypoxia: Secondary | ICD-10-CM | POA: Diagnosis present

## 2014-10-30 DIAGNOSIS — T380X5A Adverse effect of glucocorticoids and synthetic analogues, initial encounter: Secondary | ICD-10-CM | POA: Diagnosis present

## 2014-10-30 DIAGNOSIS — Z9889 Other specified postprocedural states: Secondary | ICD-10-CM

## 2014-10-30 DIAGNOSIS — J440 Chronic obstructive pulmonary disease with acute lower respiratory infection: Secondary | ICD-10-CM | POA: Diagnosis present

## 2014-10-30 DIAGNOSIS — D72829 Elevated white blood cell count, unspecified: Secondary | ICD-10-CM | POA: Diagnosis present

## 2014-10-30 DIAGNOSIS — Z825 Family history of asthma and other chronic lower respiratory diseases: Secondary | ICD-10-CM

## 2014-10-30 LAB — BASIC METABOLIC PANEL
ANION GAP: 9 (ref 5–15)
BUN: 13 mg/dL (ref 6–20)
CO2: 23 mmol/L (ref 22–32)
Calcium: 9.1 mg/dL (ref 8.9–10.3)
Chloride: 104 mmol/L (ref 101–111)
Creatinine, Ser: 1.13 mg/dL (ref 0.61–1.24)
GFR calc non Af Amer: >60 mL/min (ref >60)
Glucose, Bld: 151 mg/dL — ABNORMAL HIGH (ref 65–99)
POTASSIUM: 3.9 mmol/L (ref 3.5–5.1)
SODIUM: 136 mmol/L (ref 135–145)

## 2014-10-30 LAB — CBC
HCT: 42.7 % (ref 40.0–52.0)
Hemoglobin: 14.4 g/dL (ref 13.0–18.0)
MCH: 31.1 pg (ref 26.0–34.0)
MCHC: 33.8 g/dL (ref 32.0–36.0)
MCV: 92.1 fL (ref 80.0–100.0)
PLATELETS: 274 10*3/uL (ref 150–440)
RBC: 4.64 MIL/uL (ref 4.40–5.90)
RDW: 14 % (ref 11.5–14.5)
WBC: 15.3 10*3/uL — ABNORMAL HIGH (ref 3.8–10.6)

## 2014-10-30 LAB — TROPONIN I

## 2014-10-30 MED ORDER — IPRATROPIUM-ALBUTEROL 0.5-2.5 (3) MG/3ML IN SOLN
3.0000 mL | Freq: Once | RESPIRATORY_TRACT | Status: AC
  Administered 2014-10-30: 3 mL via RESPIRATORY_TRACT

## 2014-10-30 MED ORDER — LEVOFLOXACIN IN D5W 750 MG/150ML IV SOLN
INTRAVENOUS | Status: AC
  Administered 2014-10-30: 750 mg via INTRAVENOUS
  Filled 2014-10-30: qty 150

## 2014-10-30 MED ORDER — IPRATROPIUM-ALBUTEROL 0.5-2.5 (3) MG/3ML IN SOLN
RESPIRATORY_TRACT | Status: AC
  Administered 2014-10-30: 3 mL via RESPIRATORY_TRACT
  Filled 2014-10-30: qty 3

## 2014-10-30 MED ORDER — METHYLPREDNISOLONE SODIUM SUCC 125 MG IJ SOLR
INTRAMUSCULAR | Status: AC
  Administered 2014-10-30: 125 mg via INTRAVENOUS
  Filled 2014-10-30: qty 2

## 2014-10-30 MED ORDER — MAGNESIUM SULFATE 2 GM/50ML IV SOLN
2.0000 g | Freq: Once | INTRAVENOUS | Status: DC
  Administered 2014-10-31: 2 g via INTRAVENOUS

## 2014-10-30 MED ORDER — METHYLPREDNISOLONE SODIUM SUCC 125 MG IJ SOLR
125.0000 mg | Freq: Once | INTRAMUSCULAR | Status: AC
  Administered 2014-10-30: 125 mg via INTRAVENOUS

## 2014-10-30 MED ORDER — IPRATROPIUM-ALBUTEROL 0.5-2.5 (3) MG/3ML IN SOLN
3.0000 mL | Freq: Once | RESPIRATORY_TRACT | Status: AC
  Administered 2014-10-31: 3 mL via RESPIRATORY_TRACT

## 2014-10-30 MED ORDER — LEVOFLOXACIN IN D5W 750 MG/150ML IV SOLN
750.0000 mg | Freq: Once | INTRAVENOUS | Status: AC
  Administered 2014-10-30: 750 mg via INTRAVENOUS

## 2014-10-30 MED ORDER — SODIUM CHLORIDE 0.9 % IV BOLUS (SEPSIS)
1000.0000 mL | Freq: Once | INTRAVENOUS | Status: AC
  Administered 2014-10-30: 1000 mL via INTRAVENOUS

## 2014-10-30 MED ORDER — MAGNESIUM SULFATE 2 GM/50ML IV SOLN
INTRAVENOUS | Status: AC
  Administered 2014-10-31: 2 g via INTRAVENOUS
  Filled 2014-10-30: qty 50

## 2014-10-30 MED ORDER — IPRATROPIUM-ALBUTEROL 0.5-2.5 (3) MG/3ML IN SOLN
RESPIRATORY_TRACT | Status: AC
  Filled 2014-10-30: qty 3

## 2014-10-30 NOTE — ED Notes (Signed)
Patient c/o cough and shortness of breath along with fever.  Had a fever of 101.6, took 3 tylenol pills at 1600.  Temp now 98.7.  C/o air hunger, was seen 10/21/14 and was dx with bronchitis and acute copd exacerbation.  States the prednisone helps at first but then symptoms return in a few days.

## 2014-10-30 NOTE — ED Notes (Signed)
Patient reports having been seen 2 times here in the last couple of weeks, reports being diagnosed with bronchitis and acute exacerbation of COPD. Patient reports completing Prednisone on Monday and since that time has continually felt worse.

## 2014-10-30 NOTE — ED Notes (Signed)
Pulse ox noted to range 89-90% RA, placed on oxygen via [place] at 2 liters.

## 2014-10-30 NOTE — ED Notes (Signed)
Patient speaking in complete sentences.

## 2014-10-30 NOTE — ED Provider Notes (Signed)
[facility] Emergency Department Provider Note  ____________________________________________  Time seen: Approximately 11:09 PM  I have reviewed the triage vital signs and the nursing notes.   HISTORY  Chief Complaint Cough and Shortness of Breath    HPI Jordan [name] is a 64 y.o. male with history of COPD, former smoker who presents with 3 weeks gradual onset constant SOB and nonproductive cough. Was treated with z-pack, prednisone, albuterol without improvement. Today with worsening SOB and fever to 100.6. Currently symptoms are severe. Denies any chest pain. Breathing treatments and to make his symptoms better. No nausea, vomiting, diarrhea.   Past Medical History  Diagnosis Date  . Abdominal pain   . Inguinal hernia 2015  . COPD (chronic obstructive pulmonary disease)     Patient Active Problem List   Diagnosis Date Noted  . Inguinal hernia 06/18/2013    Past Surgical History  Procedure Laterality Date  . Hernia repair Right 2015    inguinal hernia    Current Outpatient Rx  Name  Route  Sig  Dispense  Refill  . albuterol (PROVENTIL HFA) 108 (90 BASE) MCG/ACT inhaler   Inhalation   Inhale 2 puffs into the lungs every 4 (four) hours as needed for wheezing or shortness of breath.   1 Inhaler   0   . doxycycline (VIBRAMYCIN) 50 MG capsule   Oral   Take 2 capsules (100 mg total) by mouth 2 (two) times daily.   56 capsule   0   . predniSONE (DELTASONE) 20 MG tablet   Oral   Take 2 tablets (40 mg total) by mouth daily.   10 tablet   0     Allergies Review of patient's allergies indicates no known allergies.  No family history on file.  Social History History  Substance Use Topics  . Smoking status: Current Every Day Smoker -- 0.50 packs/day  . Smokeless tobacco: Never Used  . Alcohol Use: No    Review of Systems Constitutional: + fever, no chills Eyes: No visual changes. ENT: No sore throat. Cardiovascular: Denies  chest pain. Respiratory: + shortness of breath. Gastrointestinal: No abdominal pain.  No nausea, no vomiting.  No diarrhea.  No constipation. Genitourinary: Negative for dysuria. Musculoskeletal: Negative for back pain. Skin: Negative for rash. Neurological: Negative for headaches, focal weakness or numbness.  10-point ROS otherwise negative.  ____________________________________________   PHYSICAL EXAM:  VITAL SIGNS: ED Triage Vitals  Enc Vitals Group     BP 10/30/14 1854 131/68 mmHg     Pulse Rate 10/30/14 1854 92     Resp 10/30/14 1854 18     Temp 10/30/14 1854 98.7 F (37.1 C)     Temp Source 10/30/14 1854 Oral     SpO2 10/30/14 1854 93 %     Weight --      Height --      Head Cir --      Peak Flow --      Pain Score 10/30/14 1856 0     Pain Loc --      Pain Edu? --      Excl. in GC? --     Constitutional: Alert and oriented. Sitting in tripod position, in moderate respiratory distress. Eyes: Conjunctivae are normal. PERRL. EOMI. Head: Atraumatic. Nose: No congestion/rhinnorhea. Mouth/Throat: Mucous membranes are moist.  Oropharynx non-erythematous. Neck: No stridor.  Cardiovascular: tachycardic rate, regular rhythm. Grossly normal heart sounds.  Good peripheral circulation. Respiratory: increased WOB, tachypnea, prolonged expiratory phase, diffuse expiratory  wheeze with poor air movement Gastrointestinal: Soft and nontender. No distention. No abdominal bruits. No CVA tenderness. Genitourinary: deferred Musculoskeletal: No lower extremity tenderness nor edema.  No joint effusions. Neurologic:  Normal speech and language. No gross focal neurologic deficits are appreciated. Speech is normal. No gait instability. Skin:  Skin is warm, dry and intact. No rash noted. Psychiatric: Mood and affect are normal. Speech and behavior are normal.  ____________________________________________   LABS (all labs ordered are listed, but only abnormal results are  displayed)  Labs Reviewed  BASIC METABOLIC PANEL - Abnormal; Notable for the following:    Glucose, Bld 151 (*)    All other components within normal limits  CBC - Abnormal; Notable for the following:    WBC 15.3 (*)    All other components within normal limits  CULTURE, BLOOD (ROUTINE X 2)  CULTURE, BLOOD (ROUTINE X 2)  TROPONIN I  LACTIC ACID, PLASMA  LACTIC ACID, PLASMA   ____________________________________________  EKG  ED ECG REPORT I, Gayla Doss, the attending physician, personally viewed and interpreted this ECG.   Date: 10/30/2014  EKG Time: 19:07  Rate: 97  Rhythm: normal EKG, normal sinus rhythm  Axis: normal  Intervals:none  ST&T Change: No acute ST segment elevation, Q waves in leads 3, lead 2, aVF  ____________________________________________  RADIOLOGY  CXR  IMPRESSION: Peribronchial thickening noted; lungs otherwise clear. ____________________________________________   PROCEDURES  Procedure(s) performed: None  Critical Care performed: Yes, see critical care note(s). Total critical care time spent 40 minutes.  ____________________________________________   INITIAL IMPRESSION / ASSESSMENT AND PLAN / ED COURSE  Pertinent labs & imaging results that were available during my care of the patient were reviewed by me and considered in my medical decision making (see chart for details).   ----------------------------------------- 12:13 AM on 10/31/2014 -----------------------------------------   [name] is a 64 y.o. male with history of COPD, former smoker who presents with 3 weeks gradual onset constant SOB and nonproductive cough. On exam, he has moderate respiratory distress, increased work of breathing, wheeze with poor air movement consistent with persistent COPD exacerbation. O2 sat 89-90% on room air. He has received 3 DuoNeb treatments, soluMedrol, magnesium, Levaquin and IV fluids. Because he has had minimal improvement in  symptoms after several neb treatments, discussed with hospitalist for admission at this time. ____________________________________________   FINAL CLINICAL IMPRESSION(S) / ED DIAGNOSES  Final diagnoses:  COPD with acute exacerbation      Gayla Doss, MD 10/31/14 0013

## 2014-10-31 ENCOUNTER — Encounter: Payer: Self-pay | Admitting: Internal Medicine

## 2014-10-31 DIAGNOSIS — J441 Chronic obstructive pulmonary disease with (acute) exacerbation: Secondary | ICD-10-CM | POA: Diagnosis present

## 2014-10-31 DIAGNOSIS — J209 Acute bronchitis, unspecified: Secondary | ICD-10-CM | POA: Diagnosis present

## 2014-10-31 DIAGNOSIS — Z720 Tobacco use: Secondary | ICD-10-CM | POA: Diagnosis present

## 2014-10-31 DIAGNOSIS — J9601 Acute respiratory failure with hypoxia: Secondary | ICD-10-CM | POA: Diagnosis present

## 2014-10-31 LAB — CBC
HEMATOCRIT: 42.5 % (ref 40.0–52.0)
Hemoglobin: 14.6 g/dL (ref 13.0–18.0)
MCH: 31.9 pg (ref 26.0–34.0)
MCHC: 34.3 g/dL (ref 32.0–36.0)
MCV: 92.9 fL (ref 80.0–100.0)
Platelets: 261 10*3/uL (ref 150–440)
RBC: 4.58 MIL/uL (ref 4.40–5.90)
RDW: 14.1 % (ref 11.5–14.5)
WBC: 17.3 10*3/uL — AB (ref 3.8–10.6)

## 2014-10-31 LAB — HEMOGLOBIN A1C: Hgb A1c MFr Bld: 5.9 % (ref 4.0–6.0)

## 2014-10-31 LAB — LACTIC ACID, PLASMA: Lactic Acid, Venous: 2 mmol/L (ref 0.5–2.0)

## 2014-10-31 MED ORDER — ALBUTEROL SULFATE (2.5 MG/3ML) 0.083% IN NEBU
2.5000 mg | INHALATION_SOLUTION | RESPIRATORY_TRACT | Status: DC | PRN
  Administered 2014-10-31 – 2014-11-02 (×9): 2.5 mg via RESPIRATORY_TRACT
  Filled 2014-10-31 (×10): qty 3

## 2014-10-31 MED ORDER — MENTHOL 3 MG MT LOZG
1.0000 | LOZENGE | OROMUCOSAL | Status: DC | PRN
  Filled 2014-10-31: qty 9

## 2014-10-31 MED ORDER — ONDANSETRON HCL 4 MG PO TABS: 4.0000 mg | ORAL_TABLET | Freq: Four times a day (QID) | ORAL | Status: DC | PRN

## 2014-10-31 MED ORDER — CEPASTAT 14.5 MG MT LOZG: 1.0000 | LOZENGE | OROMUCOSAL | Status: DC | PRN

## 2014-10-31 MED ORDER — GUAIFENESIN 100 MG/5ML PO SYRP
200.0000 mg | ORAL_SOLUTION | Freq: Four times a day (QID) | ORAL | Status: DC | PRN
  Administered 2014-10-31 – 2014-11-02 (×6): 200 mg via ORAL
  Filled 2014-10-31 (×8): qty 10

## 2014-10-31 MED ORDER — MOMETASONE FURO-FORMOTEROL FUM 200-5 MCG/ACT IN AERO
2.0000 | INHALATION_SPRAY | Freq: Two times a day (BID) | RESPIRATORY_TRACT | Status: DC
  Administered 2014-10-31 – 2014-11-02 (×6): 2 via RESPIRATORY_TRACT
  Filled 2014-10-31: qty 8.8

## 2014-10-31 MED ORDER — LEVOFLOXACIN IN D5W 500 MG/100ML IV SOLN
500.0000 mg | INTRAVENOUS | Status: DC
  Administered 2014-10-31: 500 mg via INTRAVENOUS
  Filled 2014-10-31 (×2): qty 100

## 2014-10-31 MED ORDER — ENOXAPARIN SODIUM 40 MG/0.4ML ~~LOC~~ SOLN
40.0000 mg | SUBCUTANEOUS | Status: DC
  Administered 2014-10-31 – 2014-11-02 (×3): 40 mg via SUBCUTANEOUS
  Filled 2014-10-31 (×3): qty 0.4

## 2014-10-31 MED ORDER — IPRATROPIUM-ALBUTEROL 0.5-2.5 (3) MG/3ML IN SOLN
RESPIRATORY_TRACT | Status: AC
  Administered 2014-10-31: 3 mL via RESPIRATORY_TRACT
  Filled 2014-10-31: qty 3

## 2014-10-31 MED ORDER — ACETAMINOPHEN 325 MG PO TABS: 650.0000 mg | ORAL_TABLET | Freq: Four times a day (QID) | ORAL | Status: DC | PRN

## 2014-10-31 MED ORDER — IPRATROPIUM BROMIDE 0.02 % IN SOLN
0.5000 mg | Freq: Four times a day (QID) | RESPIRATORY_TRACT | Status: DC
  Administered 2014-10-31 – 2014-11-02 (×10): 0.5 mg via RESPIRATORY_TRACT
  Filled 2014-10-31 (×11): qty 2.5

## 2014-10-31 MED ORDER — ACETAMINOPHEN 650 MG RE SUPP: 650.0000 mg | Freq: Four times a day (QID) | RECTAL | Status: DC | PRN

## 2014-10-31 MED ORDER — PNEUMOCOCCAL VAC POLYVALENT 25 MCG/0.5ML IJ INJ
0.5000 mL | INJECTION | INTRAMUSCULAR | Status: AC
  Administered 2014-11-02: 0.5 mL via INTRAMUSCULAR
  Filled 2014-10-31: qty 0.5

## 2014-10-31 MED ORDER — METHYLPREDNISOLONE SODIUM SUCC 40 MG IJ SOLR
40.0000 mg | Freq: Four times a day (QID) | INTRAMUSCULAR | Status: DC
  Administered 2014-10-31 – 2014-11-02 (×11): 40 mg via INTRAVENOUS
  Filled 2014-10-31 (×12): qty 1

## 2014-10-31 MED ORDER — SODIUM CHLORIDE 0.9 % IJ SOLN
3.0000 mL | Freq: Two times a day (BID) | INTRAMUSCULAR | Status: DC
  Administered 2014-10-31 – 2014-11-01 (×4): 3 mL via INTRAVENOUS

## 2014-10-31 MED ORDER — ONDANSETRON HCL 4 MG/2ML IJ SOLN: 4.0000 mg | Freq: Four times a day (QID) | INTRAMUSCULAR | Status: DC | PRN

## 2014-10-31 NOTE — Progress Notes (Signed)
ANTIBIOTIC CONSULT NOTE - INITIAL  Pharmacy Consult for Levaquin Indication: COPD exacerbation  No Known Allergies  Patient Measurements: Weight: 206 lb 3.2 oz (93.532 kg)   Vital Signs: Temp: 97.5 F (36.4 C) (06/11 0139) Temp Source: Oral (06/11 0139) BP: 136/70 mmHg (06/11 0139) Pulse Rate: 88 (06/11 0139) Intake/Output from previous day:   Intake/Output from this shift:    Labs:  Recent Labs  10/30/14 1913  WBC 15.3*  HGB 14.4  PLT 274  CREATININE 1.13   Estimated Creatinine Clearance: 79.5 mL/min (by C-G formula based on Cr of 1.13). No results for input(s): VANCOTROUGH, VANCOPEAK, VANCORANDOM, GENTTROUGH, GENTPEAK, GENTRANDOM, TOBRATROUGH, TOBRAPEAK, TOBRARND, AMIKACINPEAK, AMIKACINTROU, AMIKACIN in the last 72 hours.   Microbiology: No results found for this or any previous visit (from the past 720 hour(s)).  Medical History: Past Medical History  Diagnosis Date  . Abdominal pain   . Inguinal hernia 2015  . COPD (chronic obstructive pulmonary disease)     Medications:  Scheduled:  . enoxaparin (LOVENOX) injection  40 mg Subcutaneous Q24H  . ipratropium  0.5 mg Nebulization Q6H  . levofloxacin (LEVAQUIN) IV  500 mg Intravenous Q24H  . methylPREDNISolone (SOLU-MEDROL) injection  40 mg Intravenous Q6H  . mometasone-formoterol  2 puff Inhalation BID  . sodium chloride  3 mL Intravenous Q12H   Infusions:   PRN: acetaminophen **OR** acetaminophen, ondansetron **OR** ondansetron (ZOFRAN) IV Anti-infectives    Start     Dose/Rate Route Frequency Ordered Stop   10/31/14 1800  levofloxacin (LEVAQUIN) IVPB 500 mg     500 mg 100 mL/hr over 60 Minutes Intravenous Every 24 hours 10/31/14 0144     10/30/14 2330  levofloxacin (LEVAQUIN) IVPB 750 mg    Comments:  COPD exacerbation   750 mg 100 mL/hr over 90 Minutes Intravenous  Once 10/30/14 2318 10/31/14 0106     Assessment: Patient ordered Levaquin for COPD exacerbation received 750 mg iv once in ED.    Plan:  Levaquin 500 mg iv q 24 h.   Luisa Hart D 10/31/2014,1:44 AM

## 2014-10-31 NOTE — H&P (Signed)
[facility] - [place] at [facility]   PATIENT NAME: Jordan [name]    MR#:  [id number]  DATE OF BIRTH:  09-Jun-1950  DATE OF ADMISSION:  10/30/2014  PRIMARY CARE PHYSICIAN: No PCP Per Patient   REQUESTING/REFERRING PHYSICIAN: Chari Manning  CHIEF COMPLAINT:   Chief Complaint  Patient presents with  . Cough  . Shortness of Breath   ongoing for the past 3 weeks Fever of 100.6 today  HISTORY OF PRESENT ILLNESS:  Hilda Wexler  is a 64 y.o. male with a known history of COPD, active smoker presents to the emergency room with the complaints of ongoing cough with productive sputum and shortness of breath with wheezing for the past 3 weeks. Patient mentions he developed cough with productive sputum with associated shortness of breath and wheezing about 3 weeks ago for which he was treated with by mouth antibiotics and prednisone with minimal improvement and continued to have symptoms which were sent today, hence came to the emergency room for further evaluation. Patient also noted a temperature of 100.6 today. Denies any chest pain, palpitations, dizziness, nausea, vomiting, diarrhea, abdominal pain, dysuria. In the emergency room on arrival patient was noted to be with acute respiratory distress with hypoxia  on room air, bilateral wheezing. Patient was placed on oxygen supplementation through a nasal mask, was given IV Solu-Medrol, DuoNeb's, magnesium sulfate. Workup revealed elevated white blood cell count of 15.3, chest x-ray peribronchial thickening. EKG normal sinus rhythm with ventricular rate of 97 bpm, no acute ST-T changes. Patient was also started on IV Levaquin after obtaining blood cultures. Patient's symptoms slightly improved but continues to have some hypoxia on room air and still has significant wheezing hence hospitalist service was consulted for further management.  PAST MEDICAL HISTORY:   Past Medical History  Diagnosis Date  . Abdominal pain   . Inguinal  hernia 2015  . COPD (chronic obstructive pulmonary disease)     PAST SURGICAL HISTORY:   Past Surgical History  Procedure Laterality Date  . Hernia repair Right 2015    inguinal hernia    SOCIAL HISTORY:   History  Substance Use Topics  . Smoking status: Current Every Day Smoker -- 0.50 packs/day  . Smokeless tobacco: Never Used  . Alcohol Use: No    FAMILY HISTORY:   Family History  Problem Relation Age of Onset  . Diabetes Mother   . COPD Father   . COPD Sister     DRUG ALLERGIES:  No Known Allergies  REVIEW OF SYSTEMS:   Review of Systems  Constitutional: Positive for fever. Negative for chills and malaise/fatigue.  HENT: Negative for ear pain, hearing loss, nosebleeds, sore throat and tinnitus.   Eyes: Negative for blurred vision, double vision, pain, discharge and redness.  Respiratory: Positive for cough, sputum production, shortness of breath and wheezing. Negative for hemoptysis.   Cardiovascular: Negative for chest pain, palpitations, orthopnea and leg swelling.  Gastrointestinal: Negative for nausea, vomiting, abdominal pain, diarrhea, constipation, blood in stool and melena.  Genitourinary: Negative for dysuria, urgency, frequency and hematuria.  Musculoskeletal: Negative for back pain, joint pain and neck pain.  Skin: Negative for itching and rash.  Neurological: Negative for dizziness, tingling, sensory change, focal weakness and seizures.  Endo/Heme/Allergies: Does not bruise/bleed easily.  Psychiatric/Behavioral: Negative for depression. The patient is not nervous/anxious.     MEDICATIONS AT HOME:   Prior to Admission medications   Medication Sig Start Date End Date Taking? Authorizing Provider  acetaminophen (TYLENOL) 500  MG tablet Take 1,500 mg by mouth every 6 (six) hours as needed for moderate pain or fever.   Yes Historical Provider, MD  albuterol (PROVENTIL HFA) 108 (90 BASE) MCG/ACT inhaler Inhale 2 puffs into the lungs every 4 (four) hours  as needed for wheezing or shortness of breath. 10/21/14  Yes Sharman Cheek, MD  doxycycline (VIBRAMYCIN) 50 MG capsule Take 2 capsules (100 mg total) by mouth 2 (two) times daily. 10/21/14  Yes Sharman Cheek, MD  predniSONE (DELTASONE) 20 MG tablet Take 2 tablets (40 mg total) by mouth daily. 10/21/14   Sharman Cheek, MD      VITAL SIGNS:  Blood pressure 165/75, pulse 108, temperature 98.7 F (37.1 C), temperature source Oral, resp. rate 26, SpO2 91 %.  PHYSICAL EXAMINATION:  Physical Exam  Constitutional: He is oriented to person, place, and time. He appears well-developed and well-nourished. No distress.  HENT:  Head: Normocephalic and atraumatic.  Right Ear: External ear normal.  Left Ear: External ear normal.  Nose: Nose normal.  Mouth/Throat: Oropharynx is clear and moist. No oropharyngeal exudate.  Eyes: EOM are normal. Pupils are equal, round, and reactive to light. No scleral icterus.  Neck: Normal range of motion. Neck supple. No JVD present. No thyromegaly present.  Cardiovascular: Normal rate, regular rhythm, normal heart sounds and intact distal pulses.  Exam reveals no friction rub.   No murmur heard. Respiratory: Effort normal. No respiratory distress. He has wheezes. He has no rales. He exhibits no tenderness.  GI: Soft. Bowel sounds are normal. He exhibits no distension and no mass. There is no tenderness. There is no rebound and no guarding.  Musculoskeletal: Normal range of motion. He exhibits no edema.  Lymphadenopathy:    He has no cervical adenopathy.  Neurological: He is alert and oriented to person, place, and time. He has normal reflexes. He displays normal reflexes. No cranial nerve deficit. He exhibits normal muscle tone.  Skin: Skin is warm. No rash noted. No erythema.  Psychiatric: He has a normal mood and affect. His behavior is normal. Thought content normal.   LABORATORY PANEL:   CBC  Recent Labs Lab 10/30/14 1913  WBC 15.3*  HGB 14.4  HCT  42.7  PLT 274   ------------------------------------------------------------------------------------------------------------------  Chemistries   Recent Labs Lab 10/30/14 1913  NA 136  K 3.9  CL 104  CO2 23  GLUCOSE 151*  BUN 13  CREATININE 1.13  CALCIUM 9.1   ------------------------------------------------------------------------------------------------------------------  Cardiac Enzymes  Recent Labs Lab 10/30/14 1913  TROPONINI <0.03   ------------------------------------------------------------------------------------------------------------------  RADIOLOGY:  Dg Chest 2 View (if Patient Has Fever And/or Copd)  10/30/2014   CLINICAL DATA:  Subacute onset of shortness of breath and cough. Initial encounter.  EXAM: CHEST  2 VIEW  COMPARISON:  Chest radiograph performed 10/21/2014  FINDINGS: The lungs are well-aerated. Peribronchial thickening is noted. There is no evidence of focal opacification, pleural effusion or pneumothorax. A left-sided nipple shadow is noted.  The heart is normal in size; the mediastinal contour is within normal limits. No acute osseous abnormalities are seen.  IMPRESSION: Peribronchial thickening noted; lungs otherwise clear.   Electronically Signed   By: Roanna Raider M.D.   On: 10/30/2014 20:22    EKG:   Orders placed or performed during the hospital encounter of 10/30/14  . ED EKG  (if patient has PMH of COPD) normal sinus rhythm with ventricular rate of 97 bpm. No acute ST-T changes.   . ED EKG  (if patient has  PMH of COPD)    IMPRESSION AND PLAN:   1. Acute respiratory failure with hypoxia secondary to COPD exacerbation. 2. COPD exacerbation secondary to acute bronchitis. 3. Acute bronchitis. 4. Tobacco usage, continuous. Patient quit 1 week ago. 5. Elevated blood sugar, no history of diabetes mellitus.  Plan: Admit to MedSurg, continue O2 supplementation, vigorous DuoNeb's, IV salmeterol, IV Levaquin, Dulera, sputum for culture  sensitivity, follow-up O2 saturations. Counseled to quit smoking, offered nicotine replacement treatment, patient refused, desires to stop on his own. Check HbA1c, follow-up accordingly.    All the records are reviewed and case discussed with ED provider. Management plans discussed with the patient, family and they are in agreement.  CODE STATUS: Full code  TOTAL TIME TAKING CARE OF THIS PATIENT: 50 minutes.    Crissie Figures M.D on 10/31/2014 at 12:49 AM  Between 7am to 6pm - Pager - [phone]  After 6pm go to [email or web address] - password [email or web address]  [place] [facility]  Office  [phone]  CC: Primary care physician; No PCP Per Patient

## 2014-10-31 NOTE — ED Notes (Signed)
Patient placed on NRB @ 15L due to MD notification of current O2 saturations.

## 2014-10-31 NOTE — Progress Notes (Signed)
[facility] - [place] at [facility]   PATIENT NAME: Jordan [name]    MR#:  [id number]  DATE OF BIRTH:  07/15/1950  SUBJECTIVE:  CHIEF COMPLAINT:   Chief Complaint  Patient presents with  . Cough  . Shortness of Breath    REVIEW OF SYSTEMS:  CONSTITUTIONAL: No fever, fatigue or weakness.  EYES: No blurred or double vision.  EARS, NOSE, AND THROAT: No tinnitus or ear pain.  RESPIRATORY: positive for cough and sputum, , Shortness of breath, wheezing, no hemoptysis.  CARDIOVASCULAR: No chest pain, orthopnea, edema.  GASTROINTESTINAL: No nausea, vomiting, diarrhea or abdominal pain.  GENITOURINARY: No dysuria, hematuria.  ENDOCRINE: No polyuria, nocturia,  HEMATOLOGY: No anemia, easy bruising or bleeding SKIN: No rash or lesion. MUSCULOSKELETAL: No joint pain or arthritis.   NEUROLOGIC: No tingling, numbness, weakness.  PSYCHIATRY: No anxiety or depression.   ROS  DRUG ALLERGIES:  No Known Allergies  VITALS:  Blood pressure 165/92, pulse 91, temperature 98.3 F (36.8 C), temperature source Oral, resp. rate 19, height 6' (1.829 m), weight 93.532 kg (206 lb 3.2 oz), SpO2 95 %.  PHYSICAL EXAMINATION:  GENERAL:  64 y.o.-year-old patient lying in the bed with no acute distress.  EYES: Pupils equal, round, reactive to light and accommodation. No scleral icterus. Extraocular muscles intact.  HEENT: Head atraumatic, normocephalic. Oropharynx and nasopharynx clear.  NECK:  Supple, no jugular venous distention. No thyroid enlargement, no tenderness.  LUNGS:  breath sounds bilaterally, gross wheezing, no rales, or crepitation. No use of accessory muscles of respiration. Using oxygen. CARDIOVASCULAR: S1, S2 normal. No murmurs, rubs, or gallops.  ABDOMEN: Soft, nontender, nondistended. Bowel sounds present. No organomegaly or mass.  EXTREMITIES: No pedal edema, cyanosis, or clubbing.  NEUROLOGIC: Cranial nerves II through XII are intact. Muscle strength 5/5 in  all extremities. Sensation intact. Gait not checked.  PSYCHIATRIC: The patient is alert and oriented x 3.  SKIN: No obvious rash, lesion, or ulcer.   Physical Exam LABORATORY PANEL:   CBC  Recent Labs Lab 10/31/14 0411  WBC 17.3*  HGB 14.6  HCT 42.5  PLT 261   ------------------------------------------------------------------------------------------------------------------  Chemistries   Recent Labs Lab 10/30/14 1913  NA 136  K 3.9  CL 104  CO2 23  GLUCOSE 151*  BUN 13  CREATININE 1.13  CALCIUM 9.1   ------------------------------------------------------------------------------------------------------------------  Cardiac Enzymes  Recent Labs Lab 10/30/14 1913  TROPONINI <0.03   ------------------------------------------------------------------------------------------------------------------  RADIOLOGY:  Dg Chest 2 View (if Patient Has Fever And/or Copd)  10/30/2014   CLINICAL DATA:  Subacute onset of shortness of breath and cough. Initial encounter.  EXAM: CHEST  2 VIEW  COMPARISON:  Chest radiograph performed 10/21/2014  FINDINGS: The lungs are well-aerated. Peribronchial thickening is noted. There is no evidence of focal opacification, pleural effusion or pneumothorax. A left-sided nipple shadow is noted.  The heart is normal in size; the mediastinal contour is within normal limits. No acute osseous abnormalities are seen.  IMPRESSION: Peribronchial thickening noted; lungs otherwise clear.   Electronically Signed   By: Roanna Raider M.D.   On: 10/30/2014 20:22    ASSESSMENT AND PLAN:   * Ac respi failure Due to COPD exacerbation and Acute bronchitis.   IV steroids, Nebs, ABx.   Supplemental oxygen.    * Smoking   Councelled to quit smoking for 4 min,  * elevated WBcs- due to steroids use and bronchitis.    All the records are reviewed and case discussed with Care Management/Social Workerr.  Management plans discussed with the patient, family and they  are in agreement.  CODE STATUS: full  TOTAL TIME TAKING CARE OF THIS PATIENT: 35 minutes.   POSSIBLE D/C IN 1-2 DAYS, DEPENDING ON CLINICAL CONDITION.   Altamese Dilling M.D on 10/31/2014   Between 7am to 6pm - Pager - [phone]  After 6pm go to [email or web address] - password [email or web address] [facility]  [place] [facility]  Office  [phone]  CC: Primary care physician; No PCP Per Patient

## 2014-11-01 MED ORDER — LEVOFLOXACIN 500 MG PO TABS
500.0000 mg | ORAL_TABLET | ORAL | Status: AC
Start: 2014-11-01 — End: 2014-11-01
  Administered 2014-11-01: 500 mg via ORAL
  Filled 2014-11-01: qty 1

## 2014-11-01 MED ORDER — AMLODIPINE BESYLATE 10 MG PO TABS
10.0000 mg | ORAL_TABLET | Freq: Every day | ORAL | Status: DC
  Administered 2014-11-01 – 2014-11-02 (×2): 10 mg via ORAL
  Filled 2014-11-01 (×2): qty 1

## 2014-11-01 MED ORDER — BENZONATATE 100 MG PO CAPS
200.0000 mg | ORAL_CAPSULE | ORAL | Status: DC | PRN
  Administered 2014-11-02: 200 mg via ORAL
  Filled 2014-11-01 (×2): qty 2

## 2014-11-01 NOTE — Progress Notes (Signed)
PHARMACIST - PHYSICIAN COMMUNICATION CONCERNING: Antibiotic IV to Oral Route Change Policy  RECOMMENDATION: This patient is receiving levaquin by the intravenous route.  Based on criteria approved by the Pharmacy and Therapeutics Committee, the antibiotic(s) is/are being converted to the equivalent oral dose form(s).   DESCRIPTION: These criteria include:  Patient being treated for a respiratory tract infection, urinary tract infection, cellulitis or clostridium difficile associated diarrhea if on metronidazole  The patient is not neutropenic and does not exhibit a GI malabsorption state  The patient is eating (either orally or via tube) and/or has been taking other orally administered medications for a least 24 hours  The patient is improving clinically and has a Tmax < 100.5  If you have questions about this conversion, please contact the Pharmacy Department  Garlon Hatchet, PharmD Clinical Pharmacist 11/01/2014 11:10 AM

## 2014-11-01 NOTE — Progress Notes (Signed)
[facility] - [place] at [facility]   PATIENT NAME: Jordan [name]    MR#:  [id number]  DATE OF BIRTH:  02-02-1951  SUBJECTIVE:  CHIEF COMPLAINT:   Chief Complaint  Patient presents with  . Cough  . Shortness of Breath  little better, still could not go to bathroom after taking off oxygen this morning.   REVIEW OF SYSTEMS:  CONSTITUTIONAL: No fever, fatigue or weakness.  EYES: No blurred or double vision.  EARS, NOSE, AND THROAT: No tinnitus or ear pain.  RESPIRATORY: positive for cough and sputum, , Shortness of breath, wheezing, no hemoptysis.  CARDIOVASCULAR: No chest pain, orthopnea, edema.  GASTROINTESTINAL: No nausea, vomiting, diarrhea or abdominal pain.  GENITOURINARY: No dysuria, hematuria.  ENDOCRINE: No polyuria, nocturia,  HEMATOLOGY: No anemia, easy bruising or bleeding SKIN: No rash or lesion. MUSCULOSKELETAL: No joint pain or arthritis.   NEUROLOGIC: No tingling, numbness, weakness.  PSYCHIATRY: No anxiety or depression.   ROS  DRUG ALLERGIES:  No Known Allergies  VITALS:  Blood pressure 156/86, pulse 87, temperature 97.7 F (36.5 C), temperature source Oral, resp. rate 18, height 6' (1.829 m), weight 91.717 kg (202 lb 3.2 oz), SpO2 99 %.  PHYSICAL EXAMINATION:  GENERAL:  64 y.o.-year-old patient lying in the bed with no acute distress.  EYES: Pupils equal, round, reactive to light and accommodation. No scleral icterus. Extraocular muscles intact.  HEENT: Head atraumatic, normocephalic. Oropharynx and nasopharynx clear.  NECK:  Supple, no jugular venous distention. No thyroid enlargement, no tenderness.  LUNGS:  breath sounds bilaterally, gross wheezing, no rales, or crepitation. No use of accessory muscles of respiration. Using oxygen. CARDIOVASCULAR: S1, S2 normal. No murmurs, rubs, or gallops.  ABDOMEN: Soft, nontender, nondistended. Bowel sounds present. No organomegaly or mass.  EXTREMITIES: No pedal edema, cyanosis, or  clubbing.  NEUROLOGIC: Cranial nerves II through XII are intact. Muscle strength 5/5 in all extremities. Sensation intact. Gait not checked.  PSYCHIATRIC: The patient is alert and oriented x 3.  SKIN: No obvious rash, lesion, or ulcer.   Physical Exam LABORATORY PANEL:   CBC  Recent Labs Lab 10/31/14 0411  WBC 17.3*  HGB 14.6  HCT 42.5  PLT 261   ------------------------------------------------------------------------------------------------------------------  Chemistries   Recent Labs Lab 10/30/14 1913  NA 136  K 3.9  CL 104  CO2 23  GLUCOSE 151*  BUN 13  CREATININE 1.13  CALCIUM 9.1   ------------------------------------------------------------------------------------------------------------------  Cardiac Enzymes  Recent Labs Lab 10/30/14 1913  TROPONINI <0.03   ------------------------------------------------------------------------------------------------------------------  RADIOLOGY:  Dg Chest 2 View (if Patient Has Fever And/or Copd)  10/30/2014   CLINICAL DATA:  Subacute onset of shortness of breath and cough. Initial encounter.  EXAM: CHEST  2 VIEW  COMPARISON:  Chest radiograph performed 10/21/2014  FINDINGS: The lungs are well-aerated. Peribronchial thickening is noted. There is no evidence of focal opacification, pleural effusion or pneumothorax. A left-sided nipple shadow is noted.  The heart is normal in size; the mediastinal contour is within normal limits. No acute osseous abnormalities are seen.  IMPRESSION: Peribronchial thickening noted; lungs otherwise clear.   Electronically Signed   By: Roanna Raider M.D.   On: 10/30/2014 20:22    ASSESSMENT AND PLAN:   * Ac respi failure  Due to COPD exacerbation and Acute bronchitis.   IV steroids, Nebs, ABx.   Supplemental oxygen. Taper as toelrated   Spirometry to help alveolar ventilation.   * Smoking   Councelled to quit smoking for 4 min,  *  elevated WBcs- due to steroids use and  bronchitis.  * Hypertension  started on amlodipin.  All the records are reviewed and case discussed with Care Management/Social Workerr. Management plans discussed with the patient, family and they are in agreement.  CODE STATUS: full  TOTAL TIME TAKING CARE OF THIS PATIENT: 35 minutes.   POSSIBLE D/C IN 1-2 DAYS, DEPENDING ON CLINICAL CONDITION.   Altamese Dilling M.D on 11/01/2014   Between 7am to 6pm - Pager - [phone]  After 6pm go to [email or web address] - password [email or web address] [facility]  [place] [facility]  Office  [phone]  CC: Primary care physician; No PCP Per Patient

## 2014-11-02 LAB — CREATININE, SERUM
CREATININE: 1.11 mg/dL (ref 0.61–1.24)
GFR calc Af Amer: >60 mL/min (ref >60)

## 2014-11-02 MED ORDER — ALBUTEROL SULFATE (2.5 MG/3ML) 0.083% IN NEBU: 2.5000 mg | INHALATION_SOLUTION | RESPIRATORY_TRACT | Status: AC | PRN

## 2014-11-02 MED ORDER — LEVOFLOXACIN 500 MG PO TABS
500.0000 mg | ORAL_TABLET | Freq: Every day | ORAL | Status: DC
  Administered 2014-11-02: 500 mg via ORAL
  Filled 2014-11-02: qty 1

## 2014-11-02 MED ORDER — GUAIFENESIN 100 MG/5ML PO SYRP: 200.0000 mg | ORAL_SOLUTION | Freq: Three times a day (TID) | ORAL | Status: DC | PRN

## 2014-11-02 MED ORDER — LEVOFLOXACIN 500 MG PO TABS: 500.0000 mg | ORAL_TABLET | Freq: Every day | ORAL | Status: DC

## 2014-11-02 MED ORDER — MOMETASONE FURO-FORMOTEROL FUM 200-5 MCG/ACT IN AERO: 2.0000 | INHALATION_SPRAY | Freq: Two times a day (BID) | RESPIRATORY_TRACT | Status: AC

## 2014-11-02 MED ORDER — PREDNISONE 10 MG (21) PO TBPK: 10.0000 mg | ORAL_TABLET | Freq: Every day | ORAL | Status: DC

## 2014-11-02 MED ORDER — TIOTROPIUM BROMIDE MONOHYDRATE 18 MCG IN CAPS: 18.0000 ug | ORAL_CAPSULE | Freq: Every morning | RESPIRATORY_TRACT | Status: AC

## 2014-11-02 MED ORDER — AMLODIPINE BESYLATE 10 MG PO TABS: 10.0000 mg | ORAL_TABLET | Freq: Every day | ORAL | Status: DC

## 2014-11-02 MED ORDER — ALBUTEROL SULFATE HFA 108 (90 BASE) MCG/ACT IN AERS: 2.0000 | INHALATION_SPRAY | RESPIRATORY_TRACT | Status: AC | PRN

## 2014-11-02 MED ORDER — BENZONATATE 200 MG PO CAPS: 200.0000 mg | ORAL_CAPSULE | Freq: Three times a day (TID) | ORAL | Status: DC | PRN

## 2014-11-02 NOTE — Discharge Summary (Signed)
[facility] - [place] at [facility]   PATIENT NAME: Jordan [name]    MR#:  [id number]  DATE OF BIRTH:  [birth year]  DATE OF ADMISSION:  10/30/2014 ADMITTING PHYSICIAN: Crissie Figures, MD  DATE OF DISCHARGE: 11/02/2014  PRIMARY CARE PHYSICIAN: No PCP Per Patient    ADMISSION DIAGNOSIS:  COPD with acute exacerbation [J44.1]  DISCHARGE DIAGNOSIS:  Principal Problem:   Acute respiratory failure with hypoxia Active Problems:   COPD exacerbation   Acute bronchitis   Tobacco use   SECONDARY DIAGNOSIS:   Past Medical History  Diagnosis Date  . Abdominal pain   . Inguinal hernia 2015  . COPD (chronic obstructive pulmonary disease)     HOSPITAL COURSE:   * Ac respi failure  Due to COPD exacerbation and Acute bronchitis.  IV steroids, Nebs, ABx. Improved, still need oxygen.  Supplemental oxygen. Taper as tolerated- will need oxygen on discharge.  Spirometry to help alveolar ventilation.   Advised to follow with pulmonary clinic.  * Smoking  Councelled to quit smoking for 4 min,  * elevated WBcs- due to steroids use and bronchitis.  * Hypertension  started on amlodipin.  DISCHARGE CONDITIONS:   Stable.  CONSULTS OBTAINED:   none.  DRUG ALLERGIES:  No Known Allergies  DISCHARGE MEDICATIONS:   Current Discharge Medication List    START taking these medications   Details  albuterol (PROVENTIL) (2.5 MG/3ML) 0.083% nebulizer solution Take 3 mLs (2.5 mg total) by nebulization every 2 (two) hours as needed for wheezing or shortness of breath. Qty: 75 mL, Refills: 12    amLODipine (NORVASC) 10 MG tablet Take 1 tablet (10 mg total) by mouth daily. Qty: 30 tablet, Refills: 0    benzonatate (TESSALON) 200 MG capsule Take 1 capsule (200 mg total) by mouth 3 (three) times daily as needed for cough. Qty: 20 capsule, Refills: 0    guaifenesin (ROBITUSSIN) 100 MG/5ML syrup Take 10 mLs (200 mg total) by mouth 3 (three) times daily as  needed for cough or congestion. Qty: 120 mL, Refills: 0    levofloxacin (LEVAQUIN) 500 MG tablet Take 1 tablet (500 mg total) by mouth daily. Qty: 3 tablet, Refills: 0    mometasone-formoterol (DULERA) 200-5 MCG/ACT AERO Inhale 2 puffs into the lungs 2 (two) times daily. Qty: 1 Inhaler, Refills: 3    predniSONE (STERAPRED UNI-PAK 21 TAB) 10 MG (21) TBPK tablet Take 1 tablet (10 mg total) by mouth daily. Start 6 tab on 1st day, then taper daily 10 mg- till complete. Qty: 21 tablet, Refills: 0    tiotropium (SPIRIVA HANDIHALER) 18 MCG inhalation capsule Place 1 capsule (18 mcg total) into inhaler and inhale every morning. Qty: 30 capsule, Refills: 12      CONTINUE these medications which have CHANGED   Details  albuterol (PROVENTIL HFA) 108 (90 BASE) MCG/ACT inhaler Inhale 2 puffs into the lungs every 4 (four) hours as needed for wheezing or shortness of breath. Qty: 1 Inhaler, Refills: 0      CONTINUE these medications which have NOT CHANGED   Details  acetaminophen (TYLENOL) 500 MG tablet Take 1,500 mg by mouth every 6 (six) hours as needed for moderate pain or fever.      STOP taking these medications     doxycycline (VIBRAMYCIN) 50 MG capsule      predniSONE (DELTASONE) 20 MG tablet          DISCHARGE INSTRUCTIONS:    Follow in pulmonary clinic in 2 weeks.  If you experience worsening of your admission symptoms, develop shortness of breath, life threatening emergency, suicidal or homicidal thoughts you must seek medical attention immediately by calling 911 or calling your MD immediately  if symptoms less severe.  You Must read complete instructions/literature along with all the possible adverse reactions/side effects for all the Medicines you take and that have been prescribed to you. Take any new Medicines after you have completely understood and accept all the possible adverse reactions/side effects.   Please note  You were cared for by a hospitalist during your  hospital stay. If you have any questions about your discharge medications or the care you received while you were in the hospital after you are discharged, you can call the unit and asked to speak with the hospitalist on call if the hospitalist that took care of you is not available. Once you are discharged, your primary care physician will handle any further medical issues. Please note that NO REFILLS for any discharge medications will be authorized once you are discharged, as it is imperative that you return to your primary care physician (or establish a relationship with a primary care physician if you do not have one) for your aftercare needs so that they can reassess your need for medications and monitor your lab values.    Today   CHIEF COMPLAINT:   Chief Complaint  Patient presents with  . Cough  . Shortness of Breath    HISTORY OF PRESENT ILLNESS:  Jordan [name]  is a 64 y.o. male with a known history of COPD, active smoker presents to the emergency room with the complaints of ongoing cough with productive sputum and shortness of breath with wheezing for the past 3 weeks. Patient mentions he developed cough with productive sputum with associated shortness of breath and wheezing about 3 weeks ago for which he was treated with by mouth antibiotics and prednisone with minimal improvement and continued to have symptoms which were sent today, hence came to the emergency room for further evaluation. Patient also noted a temperature of 100.6 today. Denies any chest pain, palpitations, dizziness, nausea, vomiting, diarrhea, abdominal pain, dysuria. In the emergency room on arrival patient was noted to be with acute respiratory distress with hypoxia on room air, bilateral wheezing. Patient was placed on oxygen supplementation through a nasal mask, was given IV Solu-Medrol, DuoNeb's, magnesium sulfate. Workup revealed elevated white blood cell count of 15.3, chest x-ray peribronchial thickening. EKG  normal sinus rhythm with ventricular rate of 97 bpm, no acute ST-T changes. Patient was also started on IV Levaquin after obtaining blood cultures. Patient's symptoms slightly improved but continues to have some hypoxia on room air and still has significant wheezing hence hospitalist service was consulted for further management.   VITAL SIGNS:  Blood pressure 157/73, pulse 93, temperature 97.6 F (36.4 C), temperature source Oral, resp. rate 17, height 6' (1.829 m), weight 93.305 kg (205 lb 11.2 oz), SpO2 97 %.  I/O:   Intake/Output Summary (Last 24 hours) at 11/02/14 1148 Last data filed at 11/02/14 0800  Gross per 24 hour  Intake   1520 ml  Output      0 ml  Net   1520 ml    PHYSICAL EXAMINATION:  GENERAL: 64 y.o.-year-old patient lying in the bed with no acute distress.  EYES: Pupils equal, round, reactive to light and accommodation. No scleral icterus. Extraocular muscles intact.  HEENT: Head atraumatic, normocephalic. Oropharynx and nasopharynx clear.  NECK: Supple, no jugular venous distention.  No thyroid enlargement, no tenderness.  LUNGS: breath sounds bilaterally, expiratory wheezing, no rales, or crepitation. No use of accessory muscles of respiration. Using oxygen. CARDIOVASCULAR: S1, S2 normal. No murmurs, rubs, or gallops.  ABDOMEN: Soft, nontender, nondistended. Bowel sounds present. No organomegaly or mass.  EXTREMITIES: No pedal edema, cyanosis, or clubbing.  NEUROLOGIC: Cranial nerves II through XII are intact. Muscle strength 5/5 in all extremities. Sensation intact. Gait not checked.  PSYCHIATRIC: The patient is alert and oriented x 3.  SKIN: No obvious rash, lesion, or ulcer.   DATA REVIEW:   CBC  Recent Labs Lab 10/31/14 0411  WBC 17.3*  HGB 14.6  HCT 42.5  PLT 261    Chemistries   Recent Labs Lab 10/30/14 1913 11/02/14 0332  NA 136  --   K 3.9  --   CL 104  --   CO2 23  --   GLUCOSE 151*  --   BUN 13  --   CREATININE 1.13 1.11   CALCIUM 9.1  --     Cardiac Enzymes  Recent Labs Lab 10/30/14 1913  TROPONINI <0.03    Microbiology Results  Results for orders placed or performed during the hospital encounter of 10/30/14  Blood culture (routine x 2)     Status: None (Preliminary result)   Collection Time: 10/30/14 11:27 PM  Result Value Ref Range Status   Specimen Description BLOOD  Final   Special Requests Normal  Final   Culture NO GROWTH 3 DAYS  Final   Report Status PENDING  Incomplete  Blood culture (routine x 2)     Status: None (Preliminary result)   Collection Time: 10/30/14 11:27 PM  Result Value Ref Range Status   Specimen Description BLOOD  Final   Special Requests Normal  Final   Culture NO GROWTH 3 DAYS  Final   Report Status PENDING  Incomplete     Management plans discussed with the patient, family and they are in agreement.  CODE STATUS:     Code Status Orders        Start     Ordered   10/31/14 0132  Full code   Continuous     10/31/14 0131      TOTAL TIME TAKING CARE OF THIS PATIENT: 45 minutes.    Altamese Dilling M.D on 11/02/2014 at 11:48 AM  Between 7am to 6pm - Pager - [phone]  After 6pm go to [email or web address] - password [email or web address] [facility]  [place]  [facility]  Office  [phone]  CC: Primary care physician; No PCP Per Patient

## 2014-11-02 NOTE — Progress Notes (Signed)
Pt alert and oriented, adm with acute resp failure, O2 now at 3L via [place] with sats in the mid 90's, on IV solumedrol, scheduled svn tx, prn cough medication, on tele running NSR 80's to 90's

## 2014-11-02 NOTE — Progress Notes (Signed)
Computer went down  While charting meds .  meds given as ordered using 5 rights

## 2014-11-02 NOTE — Care Management (Deleted)
SATURATION QUALIFICATIONS: (This note is used to comply with regulatory documentation for home oxygen)  Patient Saturations on Room Air at Rest = 88%  Please briefly explain why patient needs home oxygen: COPD exacerbation.

## 2014-11-02 NOTE — Progress Notes (Signed)
Pt discharged with instructions, belongings, and portable o2   Two tanks   saline lock d/c , pt wheelchaired to truck discharged with family in attendance

## 2014-11-02 NOTE — Progress Notes (Signed)
SATURATION QUALIFICATIONS: (This note is used to comply with regulatory documentation for home oxygen)  Patient Saturations on Room Air at Rest =94*%  Patient Saturations on Room Air while Ambulating = 88%  Patient Saturations on 2 Liters of oxygen while Ambulating = 91*%  Please briefly explain why patient needs home oxygen: decreased breath sounds, has coughing which causes bronchospasm and decreases his o2 sat

## 2014-11-02 NOTE — Care Management Note (Addendum)
Case Management Note  Patient Details  Name: Jordan [name] MRN: [id number] Date of Birth: Nov 01, 1950  Subjective/Objective:                  Met with patient to discuss discharge planning. He states he lives with a friend- [name]. He states O2 is new. He has no PCP and wants this RNCM to establish him "with PCP close to [facility]". He would like to go to Acupuncturist at [facility]. He has [employer] listed as payer. He states he is able to drive.    Action/Plan: [employer] is being rejected according to [employer]. Patient updated. He will need to bring $80 to appointment if health insurance is not active. He agrees. Appointment made with Webb Silversmith NP at [facility] on November 09, 2014 at 10:30AM. New Patient information will be sent to patients address- he agrees. RNCM will follow for home O2 need.   Expected Discharge Date:                  Expected Discharge Plan:     In-House Referral:     Discharge planning Services  CM Consult  Post Acute Care Choice:    Choice offered to:  Patient  DME Arranged:    DME Agency:     HH Arranged:    [place] Agency:     Status of Service:     Medicare Important Message Given:    Date Medicare IM Given:    Medicare IM give by:    Date Additional Medicare IM Given:    Additional Medicare Important Message give by:     If discussed at [place] of Stay Meetings, dates discussed:    Additional Comments: Home O2 arranged through [place] including nebulizer machine.   Marshell Garfinkel, RN 11/02/2014, 10:05 AM

## 2014-11-02 NOTE — Progress Notes (Signed)
Albuterol given for bronchospastic cough/wheeze.

## 2014-11-04 LAB — CULTURE, BLOOD (ROUTINE X 2)
Culture: NO GROWTH
Culture: NO GROWTH
SPECIAL REQUESTS: NORMAL
Special Requests: NORMAL

## 2014-11-09 ENCOUNTER — Encounter: Payer: Self-pay | Admitting: Internal Medicine

## 2014-11-09 ENCOUNTER — Ambulatory Visit (INDEPENDENT_AMBULATORY_CARE_PROVIDER_SITE_OTHER): Payer: BLUE CROSS/BLUE SHIELD | Admitting: Internal Medicine

## 2014-11-09 VITALS — BP 146/74 | HR 88 | Temp 98.1°F | Ht 71.66 in | Wt 211.0 lb

## 2014-11-09 DIAGNOSIS — I10 Essential (primary) hypertension: Secondary | ICD-10-CM | POA: Insufficient documentation

## 2014-11-09 DIAGNOSIS — J449 Chronic obstructive pulmonary disease, unspecified: Secondary | ICD-10-CM

## 2014-11-09 DIAGNOSIS — F411 Generalized anxiety disorder: Secondary | ICD-10-CM

## 2014-11-09 MED ORDER — HYDROCHLOROTHIAZIDE 12.5 MG PO CAPS: 12.5000 mg | ORAL_CAPSULE | Freq: Every day | ORAL | Status: DC

## 2014-11-09 NOTE — Assessment & Plan Note (Addendum)
New onset Will add HCTZ in addition to your Norvasc daily Consume a DASH diet and work on exercise  RTC in 3 weeks to follow up BP

## 2014-11-09 NOTE — Assessment & Plan Note (Signed)
Chest xray reviewed Congratulated him on quitting smoking Continue inhalers Keep your scheduled follow up appt with Pulmonology at [facility]

## 2014-11-09 NOTE — Progress Notes (Signed)
Pre visit review using our clinic review tool, if applicable. No additional management support is needed unless otherwise documented below in the visit note. 

## 2014-11-09 NOTE — Patient Instructions (Addendum)
I have called in a new medication into your pharmacy. Take it as directed, once daily in the morning.  Continue your Norvasc (Amlodipine) daily. Make a follow up appt in 3 weeks for me to recheck your BP [place] all of your inhalers. Keep your follow up appt with Pulmonology at [facility]  Hypertension Hypertension, commonly called high blood pressure, is when the force of blood pumping through your arteries is too strong. Your arteries are the blood vessels that carry blood from your heart throughout your body. A blood pressure reading consists of a higher number over a lower number, such as 110/72. The higher number (systolic) is the pressure inside your arteries when your heart pumps. The lower number (diastolic) is the pressure inside your arteries when your heart relaxes. Ideally you want your blood pressure below 120/80. Hypertension forces your heart to work harder to pump blood. Your arteries may become narrow or stiff. Having hypertension puts you at risk for heart disease, stroke, and other problems.  RISK FACTORS Some risk factors for high blood pressure are controllable. Others are not.  Risk factors you cannot control include:   Race. You may be at higher risk if you are African American.  Age. Risk increases with age.  Gender. Men are at higher risk than women before age 76 years. After age 51, women are at higher risk than men. Risk factors you can control include:  Not getting enough exercise or physical activity.  Being overweight.  Getting too much fat, sugar, calories, or salt in your diet.  Drinking too much alcohol. SIGNS AND SYMPTOMS Hypertension does not usually cause signs or symptoms. Extremely high blood pressure (hypertensive crisis) may cause headache, anxiety, shortness of breath, and nosebleed. DIAGNOSIS  To check if you have hypertension, your health care provider will measure your blood pressure while you are seated, with your arm held at the level  of your heart. It should be measured at least twice using the same arm. Certain conditions can cause a difference in blood pressure between your right and left arms. A blood pressure reading that is higher than normal on one occasion does not mean that you need treatment. If one blood pressure reading is high, ask your health care provider about having it checked again. TREATMENT  Treating high blood pressure includes making lifestyle changes and possibly taking medicine. Living a healthy lifestyle can help lower high blood pressure. You may need to change some of your habits. Lifestyle changes may include:  Following the DASH diet. This diet is high in fruits, vegetables, and whole grains. It is low in salt, red meat, and added sugars.  Getting at least 2 hours of brisk physical activity every week.  Losing weight if necessary.  Not smoking.  Limiting alcoholic beverages.  Learning ways to reduce stress. If lifestyle changes are not enough to get your blood pressure under control, your health care provider may prescribe medicine. You may need to take more than one. Work closely with your health care provider to understand the risks and benefits. HOME CARE INSTRUCTIONS  Have your blood pressure rechecked as directed by your health care provider.   Take medicines only as directed by your health care provider. Follow the directions carefully. Blood pressure medicines must be taken as prescribed. The medicine does not work as well when you skip doses. Skipping doses also puts you at risk for problems.   Do not smoke.   Monitor your blood pressure at home as directed by  your health care provider. SEEK MEDICAL CARE IF:   You think you are having a reaction to medicines taken.  You have recurrent headaches or feel dizzy.  You have swelling in your ankles.  You have trouble with your vision. SEEK IMMEDIATE MEDICAL CARE IF:  You develop a severe headache or confusion.  You have  unusual weakness, numbness, or feel faint.  You have severe chest or abdominal pain.  You vomit repeatedly.  You have trouble breathing. MAKE SURE YOU:   Understand these instructions.  Will watch your condition.  Will get help right away if you are not doing well or get worse. Document Released: 05/08/2005 Document Revised: 09/22/2013 Document Reviewed: 02/28/2013 [facility] Patient Information 2015 [place], [place]. This information is not intended to replace advice given to you by your health care provider. Make sure you discuss any questions you have with your health care provider.

## 2014-11-09 NOTE — Assessment & Plan Note (Addendum)
Triggered by his SOB Discussed distraction and other relaxation techniques including deep breathing If no improvement by his next visit, consider starting Celexa for anxiety control

## 2014-11-09 NOTE — Progress Notes (Addendum)
HPI  Jordan [name] presents to the clinic today to establish care and for management of the conditions listed below. He has not had a PCP in many years.  HTN: He reports he never had high blood pressure until he started having breathing problems. He does take Norvasc daily. His last blood pressure was 151/74. He denies headache, blurred vision, chest pain or chest tightness.  COPD: He reports he has no trouble during the day. His symptoms are worse during the night but he thinks it may be related to anxiety. He takes [place] and Spiriva daily. He does not have to use the Albuterol inhaler. He is not a current smoker, he quit a few weeks ago.  Flu: never Tetanus: > 10 years ago Pneumovax: 11/02/2014 Prevnar: never PSA Screening: never  Colon Screening: never Vision Screening: as needed Dentist: as needed  Past Medical History  Diagnosis Date  . Abdominal pain   . Inguinal hernia 2015  . COPD (chronic obstructive pulmonary disease)   . Hypertension   . Chicken pox   . Childhood asthma     Current Outpatient Prescriptions  Medication Sig Dispense Refill  . albuterol (PROVENTIL HFA) 108 (90 BASE) MCG/ACT inhaler Inhale 2 puffs into the lungs every 4 (four) hours as needed for wheezing or shortness of breath. 1 Inhaler 0  . albuterol (PROVENTIL) (2.5 MG/3ML) 0.083% nebulizer solution Take 3 mLs (2.5 mg total) by nebulization every 2 (two) hours as needed for wheezing or shortness of breath. 75 mL 12  . amLODipine (NORVASC) 10 MG tablet Take 1 tablet (10 mg total) by mouth daily. 30 tablet 0  . mometasone-formoterol (DULERA) 200-5 MCG/ACT AERO Inhale 2 puffs into the lungs 2 (two) times daily. 1 Inhaler 3  . tiotropium (SPIRIVA HANDIHALER) 18 MCG inhalation capsule Place 1 capsule (18 mcg total) into inhaler and inhale every morning. 30 capsule 12   No current facility-administered medications for this visit.    No Known Allergies  Family History  Problem Relation Age of Onset  . Diabetes  Mother   . COPD Father   . COPD Sister     History   Social History  . Marital Status: Divorced    Spouse Name: N/A  . Number of Children: N/A  . Years of Education: N/A   Occupational History  . Not on file.   Social History Main Topics  . Smoking status: Former Smoker -- 0.50 packs/day  . Smokeless tobacco: Never Used     Comment: recently quit 10/21/2014  . Alcohol Use: No  . Drug Use: No  . Sexual Activity: Not on file   Other Topics Concern  . Not on file   Social History Narrative    ROS:  Constitutional: Denies fever, malaise, fatigue, headache or abrupt weight changes.  HEENT: Denies eye pain, eye redness, ear pain, ringing in the ears, wax buildup, runny nose, nasal congestion, bloody nose, or sore throat. Respiratory: Jordan [name] reports shortness of breath. Denies difficulty breathing, cough or sputum production.   Cardiovascular: Denies chest pain, chest tightness, palpitations or swelling in the hands or feet.  Gastrointestinal: Denies abdominal pain, bloating, constipation, diarrhea or blood in the stool.  GU: Denies frequency, urgency, pain with urination, blood in urine, odor or discharge. Musculoskeletal: Denies decrease in range of motion, difficulty with gait, muscle pain or joint pain and swelling.  Skin: Denies redness, rashes, lesions or ulcercations.  Neurological: Denies dizziness, difficulty with memory, difficulty with speech or problems with balance and coordination.  Psych:  Jordan [name] reports anxiety. Denies depression, SI/HI.  No other specific complaints in a complete review of systems (except as listed in HPI above).  PE:  BP 146/74 mmHg  Pulse 88  Temp(Src) 98.1 F (36.7 C) (Oral)  Ht 5' 11.66" (1.82 m)  Wt 211 lb (95.709 kg)  BMI 28.89 kg/m2  SpO2 98% Wt Readings from Last 3 Encounters:  11/09/14 211 lb (95.709 kg)  11/02/14 205 lb 11.2 oz (93.305 kg)  10/21/14 200 lb (90.719 kg)    General: Appears his stated age, well developed, well  nourished in NAD. Skin: Warm, dry and intact. No rashes, lesions or ulcerations noted. HEENT: Head: normal shape and size; Eyes: sclera white, no icterus, conjunctiva pink, PERRLA and EOMs intact;  Cardiovascular: Normal rate and rhythm. S1,S2 noted.  No murmur, rubs or gallops noted. No JVD or BLE edema. No carotid bruits noted. Pulmonary/Chest: Normal effort and positive vesicular breath sounds. No respiratory distress. No wheezes, rales or ronchi noted.  Neurological: Alert and oriented.  Psychiatric: He does seem anxious but affect normal. Behavior is normal. Judgment and thought content normal.     BMET    Component Value Date/Time   NA 136 10/30/2014 1913   K 3.9 10/30/2014 1913   CL 104 10/30/2014 1913   CO2 23 10/30/2014 1913   GLUCOSE 151* 10/30/2014 1913   BUN 13 10/30/2014 1913   CREATININE 1.11 11/02/2014 0332   CALCIUM 9.1 10/30/2014 1913   GFRNONAA >60 11/02/2014 0332   GFRAA >60 11/02/2014 0332    Lipid Panel  No results found for: CHOL, TRIG, HDL, CHOLHDL, VLDL, LDLCALC  CBC    Component Value Date/Time   WBC 17.3* 10/31/2014 0411   RBC 4.58 10/31/2014 0411   HGB 14.6 10/31/2014 0411   HCT 42.5 10/31/2014 0411   PLT 261 10/31/2014 0411   MCV 92.9 10/31/2014 0411   MCH 31.9 10/31/2014 0411   MCHC 34.3 10/31/2014 0411   RDW 14.1 10/31/2014 0411    Hgb A1C Lab Results  Component Value Date   HGBA1C 5.9 10/31/2014     Assessment and Plan:

## 2014-11-30 ENCOUNTER — Ambulatory Visit (INDEPENDENT_AMBULATORY_CARE_PROVIDER_SITE_OTHER): Payer: Self-pay | Admitting: Internal Medicine

## 2014-11-30 ENCOUNTER — Encounter: Payer: Self-pay | Admitting: Internal Medicine

## 2014-11-30 VITALS — BP 130/74 | HR 92 | Temp 98.2°F | Wt 208.0 lb

## 2014-11-30 DIAGNOSIS — I10 Essential (primary) hypertension: Secondary | ICD-10-CM

## 2014-11-30 DIAGNOSIS — K5900 Constipation, unspecified: Secondary | ICD-10-CM | POA: Insufficient documentation

## 2014-11-30 MED ORDER — AMLODIPINE BESYLATE 10 MG PO TABS: 10.0000 mg | ORAL_TABLET | Freq: Every day | ORAL | Status: AC

## 2014-11-30 MED ORDER — HYDROCHLOROTHIAZIDE 12.5 MG PO CAPS: 12.5000 mg | ORAL_CAPSULE | Freq: Every day | ORAL | Status: AC

## 2014-11-30 NOTE — Assessment & Plan Note (Signed)
Well controlled on Norvasc and HCTZ Will check BMET today ECG from 10/2014 reviewed

## 2014-11-30 NOTE — Progress Notes (Signed)
Pre visit review using our clinic review tool, if applicable. No additional management support is needed unless otherwise documented below in the visit note. 

## 2014-11-30 NOTE — Assessment & Plan Note (Signed)
Start Mirilax every other day

## 2014-11-30 NOTE — Progress Notes (Signed)
Subjective:    Patient ID: Jordan [name], male    DOB: [birth year], 64 y.o.   MRN: [id number]  HPI  Pt presents to the clinic today for 3 week follow up of HTN. He has been taking the HCTZ in addition to the Norvasc. He denies adverse effects. His BP today is 130/74. He denies chest pain, chest tightness or shortness of breath. He will need a refill on his blood pressure medications today.  He is also concerned about constipation. Sometimes he will go 2-3 days between bowel movements. He has not noticed any blood in his stool. He has tried increasing his water intake and taking Senna OTC without relief. He is not sure what else he can do.  Review of Systems      Past Medical History  Diagnosis Date  . Inguinal hernia 2015  . COPD (chronic obstructive pulmonary disease)   . Hypertension   . Chicken pox   . Childhood asthma     Current Outpatient Prescriptions  Medication Sig Dispense Refill  . albuterol (PROVENTIL HFA) 108 (90 BASE) MCG/ACT inhaler Inhale 2 puffs into the lungs every 4 (four) hours as needed for wheezing or shortness of breath. 1 Inhaler 0  . albuterol (PROVENTIL) (2.5 MG/3ML) 0.083% nebulizer solution Take 3 mLs (2.5 mg total) by nebulization every 2 (two) hours as needed for wheezing or shortness of breath. 75 mL 12  . amLODipine (NORVASC) 10 MG tablet Take 1 tablet (10 mg total) by mouth daily. 30 tablet 6  . hydrochlorothiazide (MICROZIDE) 12.5 MG capsule Take 1 capsule (12.5 mg total) by mouth daily. 30 capsule 6  . mometasone-formoterol (DULERA) 200-5 MCG/ACT AERO Inhale 2 puffs into the lungs 2 (two) times daily. 1 Inhaler 3  . tiotropium (SPIRIVA HANDIHALER) 18 MCG inhalation capsule Place 1 capsule (18 mcg total) into inhaler and inhale every morning. 30 capsule 12   No current facility-administered medications for this visit.    No Known Allergies  Family History  Problem Relation Age of Onset  . Diabetes Mother   . COPD Father   . COPD Sister      History   Social History  . Marital Status: Divorced    Spouse Name: N/A  . Number of Children: N/A  . Years of Education: N/A   Occupational History  . Not on file.   Social History Main Topics  . Smoking status: Former Smoker -- 0.50 packs/day  . Smokeless tobacco: Never Used     Comment: recently quit 10/21/2014  . Alcohol Use: No  . Drug Use: No  . Sexual Activity: Yes   Other Topics Concern  . Not on file   Social History Narrative     Constitutional: Denies fever, malaise, fatigue, headache or abrupt weight changes.  Respiratory: Denies difficulty breathing, shortness of breath, cough or sputum production.   Cardiovascular: Denies chest pain, chest tightness, palpitations or swelling in the hands or feet.  Gastrointestinal: Pt reports constipation. Denies abdominal pain, bloating, diarrhea or blood in the stool.  Neurological: Denies dizziness, difficulty with memory, difficulty with speech or problems with balance and coordination.   No other specific complaints in a complete review of systems (except as listed in HPI above).  Objective:   Physical Exam   BP 130/74 mmHg  Pulse 92  Temp(Src) 98.2 F (36.8 C) (Oral)  Wt 208 lb (94.348 kg)  SpO2 98% Wt Readings from Last 3 Encounters:  11/30/14 208 lb (94.348 kg)  11/09/14 211 lb (  95.709 kg)  11/02/14 205 lb 11.2 oz (93.305 kg)    General: Appears his stated age, well developed, well nourished in NAD. HEENT: Head: normal shape and size; Eyes: sclera white, no icterus, conjunctiva pink, PERRLA and EOMs intact;  Cardiovascular: Normal rate and rhythm. S1,S2 noted.  No murmur, rubs or gallops noted. Pulmonary/Chest: Normal effort and positive vesicular breath sounds. No respiratory distress. No wheezes, rales or ronchi noted.  Abdomen: Soft and nontender. Normal bowel sounds, no bruits noted. No distention or masses noted.  Neurological: Alert and oriented.   BMET    Component Value Date/Time   NA 136  10/30/2014 1913   K 3.9 10/30/2014 1913   CL 104 10/30/2014 1913   CO2 23 10/30/2014 1913   GLUCOSE 151* 10/30/2014 1913   BUN 13 10/30/2014 1913   CREATININE 1.11 11/02/2014 0332   CALCIUM 9.1 10/30/2014 1913   GFRNONAA >60 11/02/2014 0332   GFRAA >60 11/02/2014 0332    Lipid Panel  No results found for: CHOL, TRIG, HDL, CHOLHDL, VLDL, LDLCALC  CBC    Component Value Date/Time   WBC 17.3* 10/31/2014 0411   RBC 4.58 10/31/2014 0411   HGB 14.6 10/31/2014 0411   HCT 42.5 10/31/2014 0411   PLT 261 10/31/2014 0411   MCV 92.9 10/31/2014 0411   MCH 31.9 10/31/2014 0411   MCHC 34.3 10/31/2014 0411   RDW 14.1 10/31/2014 0411    Hgb A1C Lab Results  Component Value Date   HGBA1C 5.9 10/31/2014        Assessment & Plan:

## 2014-11-30 NOTE — Patient Instructions (Signed)

## 2014-12-01 ENCOUNTER — Encounter: Payer: Self-pay | Admitting: *Deleted

## 2014-12-01 LAB — BASIC METABOLIC PANEL
BUN: 9 mg/dL (ref 6–23)
CO2: 29 mEq/L (ref 19–32)
CREATININE: 0.97 mg/dL (ref 0.40–1.50)
Calcium: 9.6 mg/dL (ref 8.4–10.5)
Chloride: 96 mEq/L (ref 96–112)
GFR: 82.8 mL/min (ref >60.00)
Glucose, Bld: 96 mg/dL (ref 70–99)
Potassium: 3.9 mEq/L (ref 3.5–5.1)
SODIUM: 133 meq/L — AB (ref 135–145)

## 2016-04-11 IMAGING — CR DG CHEST 2V
1 series · 2 of 2 positions shown · non-contrast
Comparison: Chest radiograph performed 10/21/2014

CLINICAL DATA: Subacute onset of shortness of breath and cough.
Initial encounter.

EXAM:
CHEST  2 VIEW

[Series 1: dg chest 2 view · 0.14mm/px · 2 of 2 slices shown]
[im 1/2]
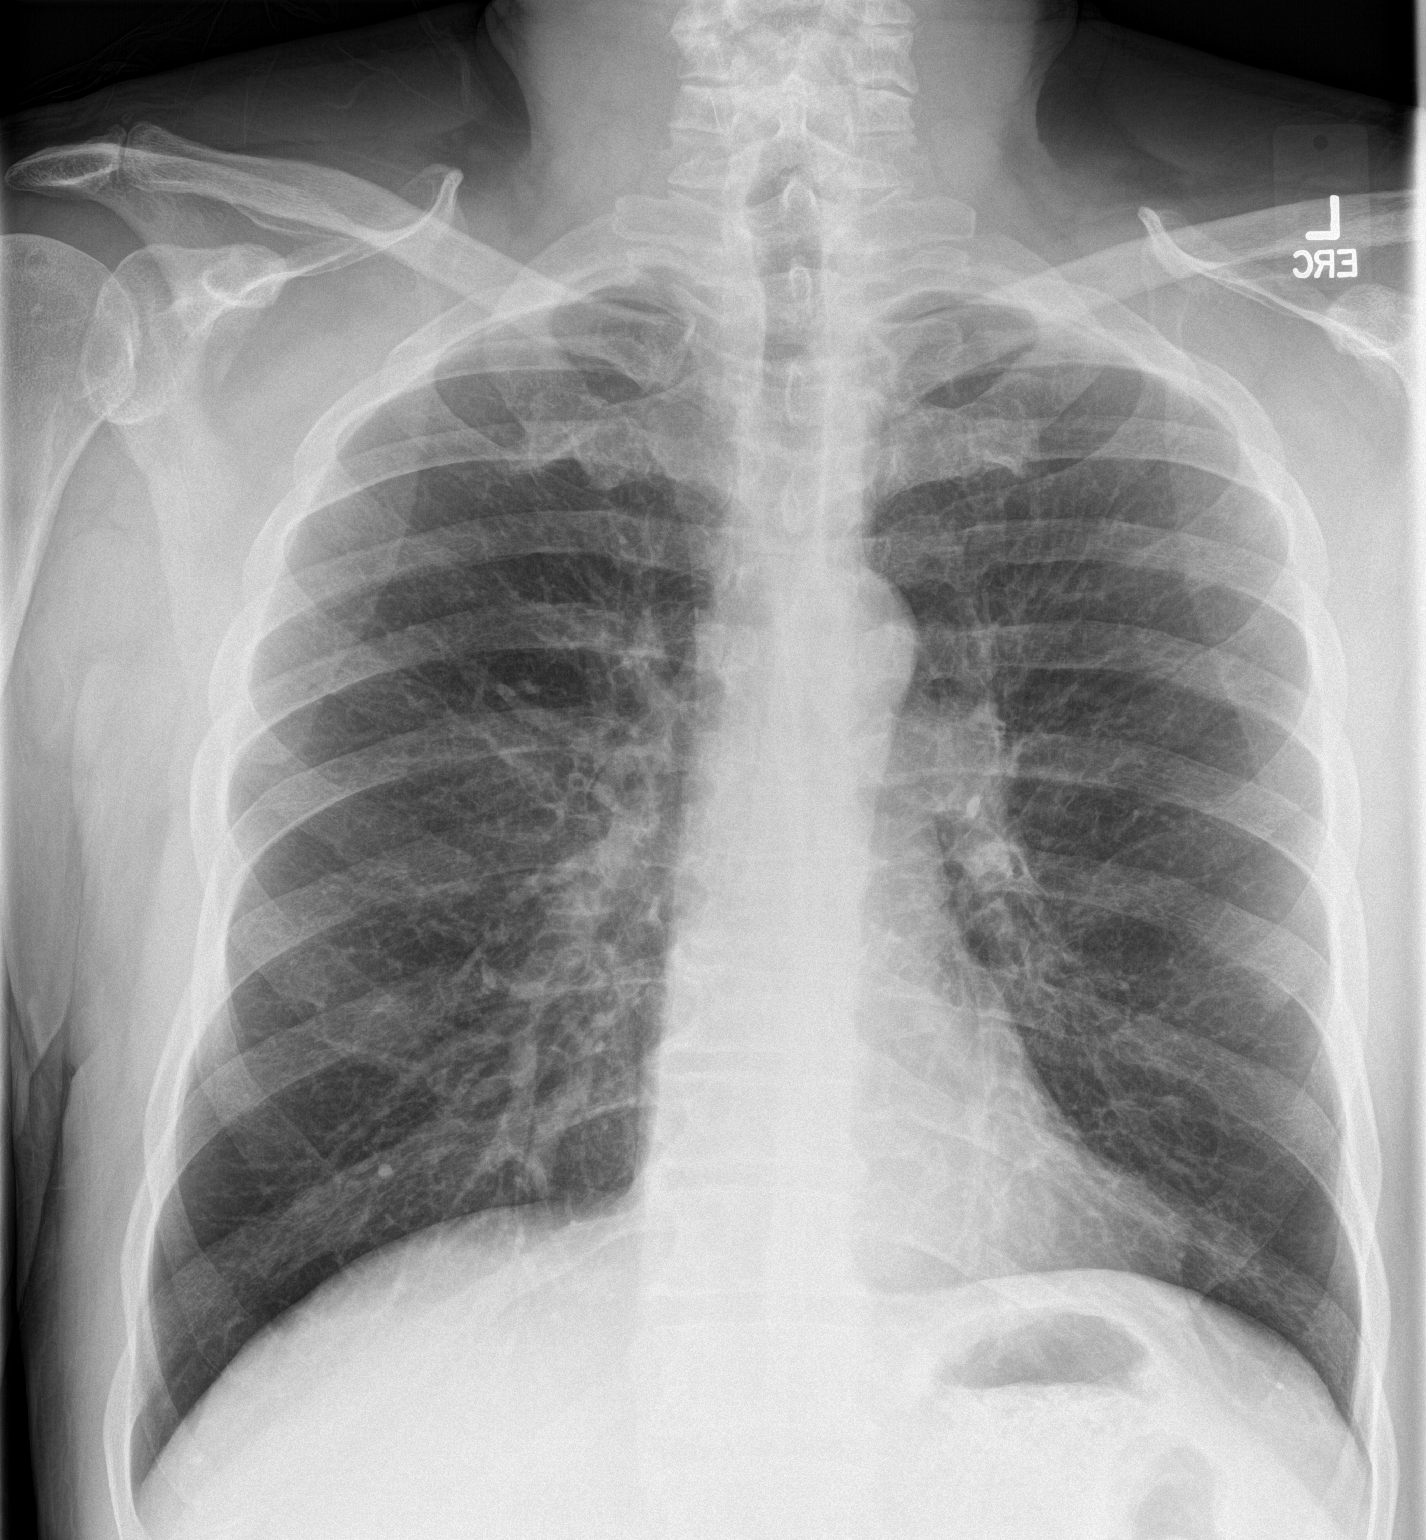
[im 2/2]
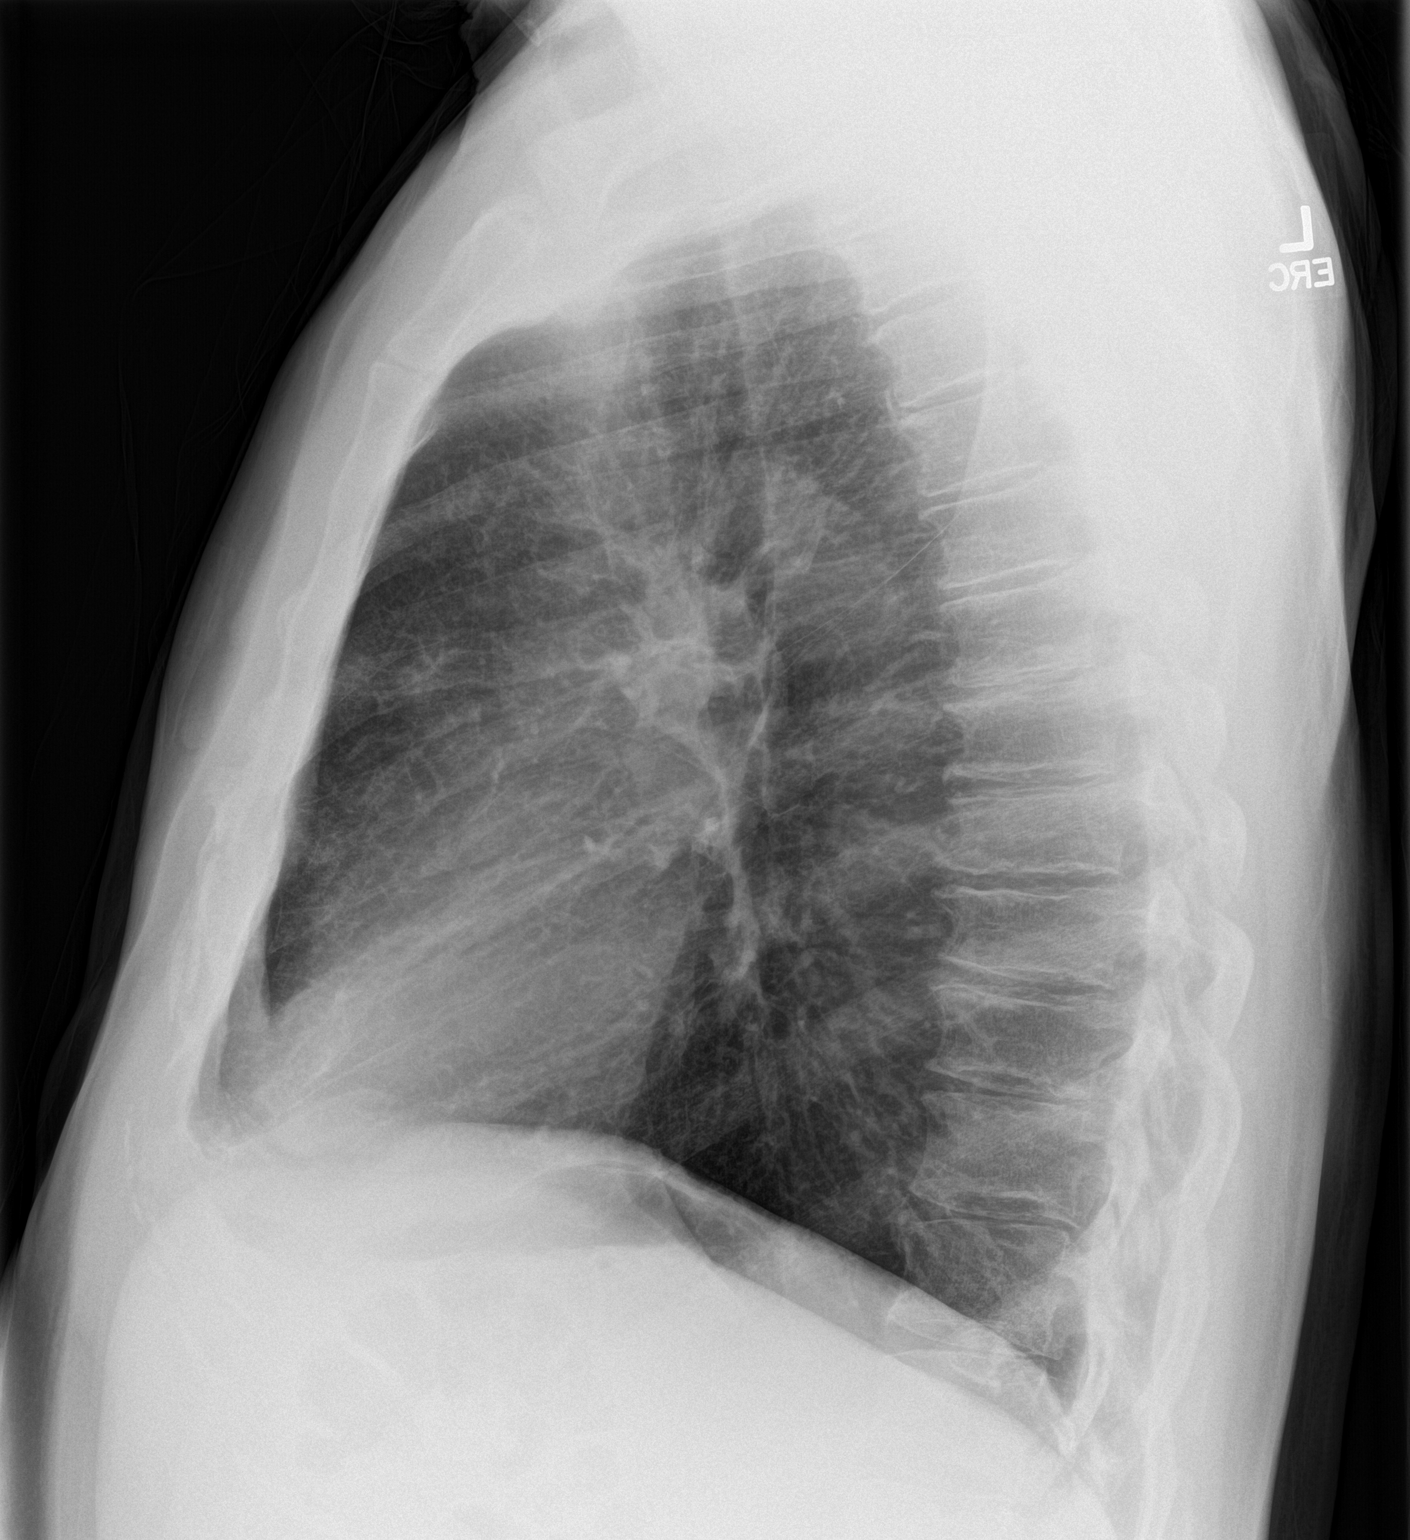

[2 of 2 positions shown; findings below may reference images not displayed]

FINDINGS: The lungs are well-aerated. Peribronchial thickening is noted. There
is no evidence of focal opacification, pleural effusion or
pneumothorax. A left-sided nipple shadow is noted.

The heart is normal in size; the mediastinal contour is within
normal limits. No acute osseous abnormalities are seen.
IMPRESSION: Peribronchial thickening noted; lungs otherwise clear.
